# Patient Record
Sex: Female | Born: 1995 | Race: White | Hispanic: No | Marital: Single | Smoking: Current some day smoker
Health system: Southern US, Community
[De-identification: ages and names within clinical notes are randomized; demographics above are authoritative.]

## PROBLEM LIST (undated history)

## (undated) ENCOUNTER — Inpatient Hospital Stay: Payer: Self-pay

## (undated) DIAGNOSIS — D649 Anemia, unspecified: Secondary | ICD-10-CM

## (undated) HISTORY — PX: OTHER SURGICAL HISTORY: SHX169

## (undated) HISTORY — PX: NO PAST SURGERIES: SHX2092

---
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
## 2017-01-16 ENCOUNTER — Observation Stay
Admission: EM | Admit: 2017-01-16 | Discharge: 2017-01-16 | Disposition: A | Discharge status: Home / Self Care | Payer: Medicaid Other | Attending: Obstetrics and Gynecology | Admitting: Obstetrics and Gynecology

## 2017-01-16 DIAGNOSIS — R51 Headache: Secondary | ICD-10-CM | POA: Diagnosis present

## 2017-01-16 DIAGNOSIS — Z3A36 36 weeks gestation of pregnancy: Secondary | ICD-10-CM

## 2017-01-16 DIAGNOSIS — O26893 Other specified pregnancy related conditions, third trimester: Secondary | ICD-10-CM

## 2017-01-16 DIAGNOSIS — M545 Low back pain: Secondary | ICD-10-CM | POA: Diagnosis not present

## 2017-01-16 HISTORY — DX: Anemia, unspecified: D64.9

## 2017-01-16 LAB — URINALYSIS, ROUTINE W REFLEX MICROSCOPIC
Bilirubin Urine: NEGATIVE
GLUCOSE, UA: NEGATIVE mg/dL
HGB URINE DIPSTICK: NEGATIVE
Ketones, ur: NEGATIVE mg/dL
Leukocytes, UA: NEGATIVE
Nitrite: NEGATIVE
PH: 6 (ref 5.0–8.0)
PROTEIN: NEGATIVE mg/dL
Specific Gravity, Urine: 1.024 (ref 1.005–1.030)

## 2017-01-16 MED ORDER — ACETAMINOPHEN 325 MG PO TABS
650.0000 mg | ORAL_TABLET | Freq: Once | ORAL | Status: AC
  Administered 2017-01-16: 650 mg via ORAL

## 2017-01-16 MED ORDER — ACETAMINOPHEN 325 MG PO TABS
ORAL_TABLET | ORAL | Status: AC
  Filled 2017-01-16: qty 2

## 2017-01-16 NOTE — Final Progress Note (Signed)
L&D OB Triage Note  Lacey Riggs is a 21 y.o. G1P0 female at 6648w6d, EDD Estimated Date of Delivery: 02/07/17 who presented to triage for complaints of headache (2/10) for several days, and low back pain x 1 week.  She has not tried any remedies for relief.  She is [facility] (but notes that she is planning to transfer to this area).  She was evaluated by the nurses with no significant findings. Vital signs stable. An NST was performed and has been reviewed by MD. She was treated with Tylenol ES with improvement of symptoms.    NST INTERPRETATION: Indications: rule out uterine contractions  Mode: External Baseline Rate (A): 145 bpm Variability: Moderate Accelerations: 15 x 15 Decelerations: None     Contraction Frequency (min):  (None)  Impression: reactive     Labs:  Results for orders placed or performed during the hospital encounter of 01/16/17  Urinalysis, Routine w reflex microscopic  Result Value Ref Range   Color, Urine AMBER (A) YELLOW   APPearance HAZY (A) CLEAR   Specific Gravity, Urine 1.024 1.005 - 1.030   pH 6.0 5.0 - 8.0   Glucose, UA NEGATIVE NEGATIVE mg/dL   Hgb urine dipstick NEGATIVE NEGATIVE   Bilirubin Urine NEGATIVE NEGATIVE   Ketones, ur NEGATIVE NEGATIVE mg/dL   Protein, ur NEGATIVE NEGATIVE mg/dL   Nitrite NEGATIVE NEGATIVE   Leukocytes, UA NEGATIVE NEGATIVE    Plan: NST performed was reviewed and was found to be reactive. She was discharged home with bleeding/labor precautions. A GBS and GC/Cl was collected.  Continue routine prenatal care. Follow up with OB/GYN as previously scheduled.     Hildred LaserAnika Malita Ignasiak, MD [facility]

## 2017-01-16 NOTE — OB Triage Note (Signed)
Pt states that she has been having some lower back pain for about a week, and she starting having a headache the last couple of days. She also c/o swelling in her feet and ankles.

## 2017-01-18 LAB — CULTURE, BETA STREP (GROUP B ONLY)

## 2017-01-20 DIAGNOSIS — Z23 Encounter for immunization: Secondary | ICD-10-CM | POA: Insufficient documentation

## 2017-02-04 ENCOUNTER — Observation Stay
Admission: EM | Admit: 2017-02-04 | Discharge: 2017-02-04 | Disposition: A | Discharge status: Home / Self Care | Payer: Medicaid Other | Attending: Certified Nurse Midwife | Admitting: Certified Nurse Midwife

## 2017-02-04 DIAGNOSIS — O26893 Other specified pregnancy related conditions, third trimester: Secondary | ICD-10-CM | POA: Diagnosis present

## 2017-02-04 DIAGNOSIS — Z3A39 39 weeks gestation of pregnancy: Secondary | ICD-10-CM | POA: Insufficient documentation

## 2017-02-04 DIAGNOSIS — O471 False labor at or after 37 completed weeks of gestation: Secondary | ICD-10-CM | POA: Diagnosis not present

## 2017-02-04 DIAGNOSIS — O0933 Supervision of pregnancy with insufficient antenatal care, third trimester: Secondary | ICD-10-CM | POA: Insufficient documentation

## 2017-02-04 LAB — URINE DRUG SCREEN, QUALITATIVE (ARMC ONLY)
Amphetamines, Ur Screen: NOT DETECTED
BENZODIAZEPINE, UR SCRN: NOT DETECTED
Barbiturates, Ur Screen: NOT DETECTED
CANNABINOID 50 NG, UR ~~LOC~~: NOT DETECTED
Cocaine Metabolite,Ur ~~LOC~~: NOT DETECTED
MDMA (Ecstasy)Ur Screen: NOT DETECTED
Methadone Scn, Ur: NOT DETECTED
OPIATE, UR SCREEN: NOT DETECTED
PHENCYCLIDINE (PCP) UR S: NOT DETECTED
Tricyclic, Ur Screen: NOT DETECTED

## 2017-02-04 LAB — URINALYSIS, COMPLETE (UACMP) WITH MICROSCOPIC
Bacteria, UA: NONE SEEN
Bilirubin Urine: NEGATIVE
Glucose, UA: NEGATIVE mg/dL
Hgb urine dipstick: NEGATIVE
KETONES UR: NEGATIVE mg/dL
Nitrite: NEGATIVE
PH: 6 (ref 5.0–8.0)
PROTEIN: NEGATIVE mg/dL
Specific Gravity, Urine: 1.021 (ref 1.005–1.030)

## 2017-02-04 LAB — ROM PLUS (ARMC ONLY): Rom Plus: NEGATIVE

## 2017-02-04 MED ORDER — ACETAMINOPHEN 325 MG PO TABS
650.0000 mg | ORAL_TABLET | Freq: Four times a day (QID) | ORAL | Status: AC | PRN
  Administered 2017-02-04: 650 mg via ORAL
  Filled 2017-02-04: qty 2

## 2017-02-04 NOTE — Progress Notes (Signed)
Vaguely answers questions concerning care and her last visit.  Is in the "process of transferring" care to [facility] but doesn't have an appointment with [facility].  Encouraged to make an appt and keep it.  Discussed importance of care.  Pt states "I don't need to go, they want me to go every week but I'm not going.  I'm going to have him in a couple of days anyway."

## 2017-02-04 NOTE — Progress Notes (Signed)
ROM plus results reviewed by CNM. Pt OK to discharge home. Reviewed discharge instructions with patient. She denies questions or concerns. Pt will follow up with Phineas DouglasMichelle Lawhorne, CNM at 1230pm tomorrow at [facility].

## 2017-02-04 NOTE — OB Triage Note (Addendum)
[facility] with left upper abdominal pain and sharp pain in the lower middle abdomen, especially in the morning. Pt first felt pain a couple days ago and says "knew I was getting close so I wanted to see how dilated I was." She states she has not felt  Baby move since yesterday. Denies vaginal bleeding and unusual discharge. Pt "unsure about LOF" She says she saw clear fluid "about 2 weeks ago." VSS. Monitors applied and assessing.

## 2017-02-05 ENCOUNTER — Observation Stay
Admission: EM | Admit: 2017-02-05 | Discharge: 2017-02-05 | Disposition: A | Discharge status: Home / Self Care | Payer: Medicaid Other | Attending: Certified Nurse Midwife | Admitting: Certified Nurse Midwife

## 2017-02-05 ENCOUNTER — Encounter: Payer: Self-pay | Admitting: *Deleted

## 2017-02-05 ENCOUNTER — Encounter: Payer: Self-pay | Admitting: Certified Nurse Midwife

## 2017-02-05 DIAGNOSIS — Z3A39 39 weeks gestation of pregnancy: Secondary | ICD-10-CM | POA: Insufficient documentation

## 2017-02-05 DIAGNOSIS — O471 False labor at or after 37 completed weeks of gestation: Principal | ICD-10-CM | POA: Insufficient documentation

## 2017-02-05 NOTE — ED Notes (Signed)
Faxed over Patient Record Release from to [facility]
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
## 2017-02-06 ENCOUNTER — Other Ambulatory Visit: Payer: Self-pay | Admitting: Certified Nurse Midwife

## 2017-02-06 DIAGNOSIS — O480 Post-term pregnancy: Secondary | ICD-10-CM

## 2017-02-06 DIAGNOSIS — Z3402 Encounter for supervision of normal first pregnancy, second trimester: Secondary | ICD-10-CM | POA: Diagnosis not present

## 2017-02-09 ENCOUNTER — Other Ambulatory Visit: Payer: Self-pay

## 2017-02-09 ENCOUNTER — Other Ambulatory Visit: Payer: Self-pay | Admitting: Certified Nurse Midwife

## 2017-02-10 ENCOUNTER — Encounter: Payer: Self-pay | Admitting: Certified Nurse Midwife

## 2017-02-10 ENCOUNTER — Ambulatory Visit (INDEPENDENT_AMBULATORY_CARE_PROVIDER_SITE_OTHER): Payer: Medicaid Other | Admitting: Certified Nurse Midwife

## 2017-02-10 ENCOUNTER — Ambulatory Visit (INDEPENDENT_AMBULATORY_CARE_PROVIDER_SITE_OTHER): Payer: Medicaid Other

## 2017-02-10 ENCOUNTER — Inpatient Hospital Stay
Admission: EM | Admit: 2017-02-10 | Discharge: 2017-02-15 | DRG: 765 | Disposition: A | Discharge status: Home / Self Care | Payer: Medicaid Other | Attending: Certified Nurse Midwife | Admitting: Certified Nurse Midwife

## 2017-02-10 VITALS — BP 131/81 | HR 92 | Wt 209.5 lb

## 2017-02-10 DIAGNOSIS — D649 Anemia, unspecified: Secondary | ICD-10-CM | POA: Diagnosis present

## 2017-02-10 DIAGNOSIS — O324XX0 Maternal care for high head at term, not applicable or unspecified: Secondary | ICD-10-CM | POA: Diagnosis present

## 2017-02-10 DIAGNOSIS — Z3403 Encounter for supervision of normal first pregnancy, third trimester: Secondary | ICD-10-CM

## 2017-02-10 DIAGNOSIS — O9832 Other infections with a predominantly sexual mode of transmission complicating childbirth: Secondary | ICD-10-CM | POA: Diagnosis present

## 2017-02-10 DIAGNOSIS — O99824 Streptococcus B carrier state complicating childbirth: Secondary | ICD-10-CM | POA: Diagnosis present

## 2017-02-10 DIAGNOSIS — O9902 Anemia complicating childbirth: Secondary | ICD-10-CM | POA: Diagnosis present

## 2017-02-10 DIAGNOSIS — Z3A40 40 weeks gestation of pregnancy: Secondary | ICD-10-CM

## 2017-02-10 DIAGNOSIS — O339 Maternal care for disproportion, unspecified: Secondary | ICD-10-CM | POA: Diagnosis present

## 2017-02-10 DIAGNOSIS — Z3493 Encounter for supervision of normal pregnancy, unspecified, third trimester: Secondary | ICD-10-CM | POA: Diagnosis present

## 2017-02-10 DIAGNOSIS — O4103X0 Oligohydramnios, third trimester, not applicable or unspecified: Principal | ICD-10-CM | POA: Diagnosis present

## 2017-02-10 DIAGNOSIS — O480 Post-term pregnancy: Secondary | ICD-10-CM | POA: Diagnosis not present

## 2017-02-10 DIAGNOSIS — A568 Sexually transmitted chlamydial infection of other sites: Secondary | ICD-10-CM | POA: Diagnosis present

## 2017-02-10 LAB — TYPE AND SCREEN
ABO/RH(D): O POS
ANTIBODY SCREEN: NEGATIVE

## 2017-02-10 LAB — POCT URINALYSIS DIPSTICK
BILIRUBIN UA: NEGATIVE
Blood, UA: NEGATIVE
Glucose, UA: NEGATIVE
KETONES UA: NEGATIVE
LEUKOCYTES UA: NEGATIVE
Nitrite, UA: NEGATIVE
PROTEIN UA: NEGATIVE
Spec Grav, UA: 1.02 (ref 1.010–1.025)
Urobilinogen, UA: 0.2 E.U./dL
pH, UA: 6.5 (ref 5.0–8.0)

## 2017-02-10 LAB — URINE DRUG SCREEN, QUALITATIVE (ARMC ONLY)
AMPHETAMINES, UR SCREEN: NOT DETECTED
BARBITURATES, UR SCREEN: NOT DETECTED
BENZODIAZEPINE, UR SCRN: NOT DETECTED
Cannabinoid 50 Ng, Ur ~~LOC~~: NOT DETECTED
Cocaine Metabolite,Ur ~~LOC~~: NOT DETECTED
MDMA (Ecstasy)Ur Screen: NOT DETECTED
METHADONE SCREEN, URINE: NOT DETECTED
Opiate, Ur Screen: NOT DETECTED
Phencyclidine (PCP) Ur S: NOT DETECTED
TRICYCLIC, UR SCREEN: NOT DETECTED

## 2017-02-10 LAB — CBC
HEMATOCRIT: 32.4 % — AB (ref 35.0–47.0)
HEMOGLOBIN: 11.3 g/dL — AB (ref 12.0–16.0)
MCH: 29.9 pg (ref 26.0–34.0)
MCHC: 34.8 g/dL (ref 32.0–36.0)
MCV: 85.8 fL (ref 80.0–100.0)
Platelets: 222 10*3/uL (ref 150–440)
RBC: 3.78 MIL/uL — ABNORMAL LOW (ref 3.80–5.20)
RDW: 14.5 % (ref 11.5–14.5)
WBC: 9.8 10*3/uL (ref 3.6–11.0)

## 2017-02-10 LAB — CHLAMYDIA/NGC RT PCR (ARMC ONLY)
Chlamydia Tr: DETECTED — AB
N gonorrhoeae: NOT DETECTED

## 2017-02-10 MED ORDER — PENICILLIN G POT IN DEXTROSE 60000 UNIT/ML IV SOLN
3.0000 10*6.[IU] | INTRAVENOUS | Status: DC
  Administered 2017-02-10 – 2017-02-11 (×5): 3 10*6.[IU] via INTRAVENOUS
  Filled 2017-02-10 (×16): qty 50

## 2017-02-10 MED ORDER — OXYTOCIN 40 UNITS IN LACTATED RINGERS INFUSION - SIMPLE MED
2.5000 [IU]/h | INTRAVENOUS | Status: DC
  Administered 2017-02-11: 1000 mL via INTRAVENOUS

## 2017-02-10 MED ORDER — ACETAMINOPHEN 325 MG PO TABS
650.0000 mg | ORAL_TABLET | ORAL | Status: DC | PRN
  Administered 2017-02-11: 650 mg via ORAL
  Filled 2017-02-10: qty 2

## 2017-02-10 MED ORDER — BUTORPHANOL TARTRATE 2 MG/ML IJ SOLN
1.0000 mg | INTRAMUSCULAR | Status: DC | PRN
  Administered 2017-02-11 (×2): 1 mg via INTRAVENOUS
  Filled 2017-02-10 (×2): qty 1

## 2017-02-10 MED ORDER — OXYTOCIN BOLUS FROM INFUSION: 500.0000 mL | Freq: Once | INTRAVENOUS | Status: DC

## 2017-02-10 MED ORDER — TERBUTALINE SULFATE 1 MG/ML IJ SOLN: 0.2500 mg | Freq: Once | INTRAMUSCULAR | Status: DC | PRN

## 2017-02-10 MED ORDER — OXYTOCIN 40 UNITS IN LACTATED RINGERS INFUSION - SIMPLE MED
1.0000 m[IU]/min | INTRAVENOUS | Status: DC
  Filled 2017-02-10 (×3): qty 1000

## 2017-02-10 MED ORDER — MISOPROSTOL 25 MCG QUARTER TABLET
ORAL_TABLET | ORAL | Status: AC
  Filled 2017-02-10: qty 2

## 2017-02-10 MED ORDER — SOD CITRATE-CITRIC ACID 500-334 MG/5ML PO SOLN
30.0000 mL | ORAL | Status: DC | PRN
  Administered 2017-02-11: 30 mL via ORAL
  Filled 2017-02-10: qty 15

## 2017-02-10 MED ORDER — MISOPROSTOL 25 MCG QUARTER TABLET
50.0000 ug | ORAL_TABLET | Freq: Once | ORAL | Status: AC
  Administered 2017-02-10: 50 ug via ORAL

## 2017-02-10 MED ORDER — ONDANSETRON HCL 4 MG/2ML IJ SOLN: 4.0000 mg | Freq: Four times a day (QID) | INTRAMUSCULAR | Status: DC | PRN

## 2017-02-10 MED ORDER — LIDOCAINE HCL (PF) 1 % IJ SOLN
30.0000 mL | INTRAMUSCULAR | Status: DC | PRN
  Filled 2017-02-10: qty 30

## 2017-02-10 MED ORDER — LACTATED RINGERS IV SOLN
INTRAVENOUS | Status: DC
  Administered 2017-02-10 – 2017-02-11 (×3): 1000 mL via INTRAVENOUS

## 2017-02-10 MED ORDER — LACTATED RINGERS IV SOLN
500.0000 mL | INTRAVENOUS | Status: DC | PRN
  Administered 2017-02-11: 500 mL via INTRAVENOUS

## 2017-02-10 MED ORDER — MISOPROSTOL 200 MCG PO TABS
ORAL_TABLET | ORAL | Status: AC
  Administered 2017-02-10: 25 ug via ORAL
  Filled 2017-02-10: qty 4

## 2017-02-10 MED ORDER — AMMONIA AROMATIC IN INHA
RESPIRATORY_TRACT | Status: AC
  Filled 2017-02-10: qty 10

## 2017-02-10 MED ORDER — MISOPROSTOL 25 MCG QUARTER TABLET
25.0000 ug | ORAL_TABLET | ORAL | Status: DC
  Administered 2017-02-10: 25 ug via ORAL
  Filled 2017-02-10: qty 1

## 2017-02-10 MED ORDER — OXYTOCIN 10 UNIT/ML IJ SOLN: 10.0000 [IU] | Freq: Once | INTRAMUSCULAR | Status: DC

## 2017-02-10 MED ORDER — PENICILLIN G POTASSIUM 5000000 UNITS IJ SOLR
5.0000 10*6.[IU] | Freq: Once | INTRAVENOUS | Status: AC
  Administered 2017-02-10: 5 10*6.[IU] via INTRAVENOUS
  Filled 2017-02-10 (×2): qty 5

## 2017-02-10 NOTE — H&P (Signed)
History and Physical   HPI  Lacey BerkshireCasey R Riggs is a 21 y.o. G1P0 at 4558w3d Estimated Date of Delivery: 02/07/17 who is being admitted for induction of labor. PT presented to office today for BPP @ 40 wk 3 days. BPP 6/8 for   AFI -0, EFW: 3462 g 41st percentile. She had been receiving [facility] Valentino Sax[facility]herry [facility].    OB History  Obstetric History   G1   P0   T0   P0   A0   L0    SAB0   TAB0   Ectopic0   Multiple0   Live Births0     # Outcome Date GA Lbr Len/2nd Weight Sex Delivery Anes PTL Lv  1 Current               PROBLEM LIST  Pregnancy complications or risks: Patient Active Problem List   Diagnosis Date Noted  . Labor and delivery, indication for care 02/10/2017  . Indication for care in labor and delivery, antepartum 02/04/2017  . Need for chickenpox vaccination 01/20/2017  . Indication for care in labor or delivery 01/16/2017    Prenatal labs and studies: ABO, Rh: O/Positive/-- (04/27 0000) Antibody: Negative (04/27 0000) Rubella: Immune (04/27 0000) RPR: Nonreactive (05/18 0000)  HBsAg: Negative (04/27 0000)  HIV: Non-reactive (05/18 0000)  GBS: Positive  1 hr Glucola : WNL Genetic screening Not completed Anatomy US normal  Prenatal Transfer Tool  Maternal Diabetes: No Genetic Screening: Declined Maternal Ultrasounds/Referrals: Declined Fetal Ultrasounds or other Referrals:  None Maternal Substance Abuse:  No Significant Maternal Medications:  None Significant Maternal Lab Results: None   Past Medical History:  Diagnosis Date  . Anemia      Past Surgical History:  Procedure Laterality Date  . NO PAST SURGERIES    . none       Medications    Current Discharge Medication List    CONTINUE these medications which have NOT CHANGED   Details  Prenatal Vit-Fe Fumarate-FA (MULTIVITAMIN-PRENATAL) 27-0.8 MG TABS tablet Take 1 tablet by mouth daily at 12 noon.          Allergies  Patient has no known allergies.  Review of Systems  Constitutional: negative Eyes: negative Ears, nose, mouth, throat, and face: negative Respiratory: negative Cardiovascular: negative Gastrointestinal: negative Genitourinary:negative Integument/breast: negative Hematologic/lymphatic: negative Musculoskeletal:negative Neurological: negative Behavioral/Psych: negative Endocrine: negative Allergic/Immunologic: negative  Physical Exam  BP 128/65 (BP Location: Right Arm)   Pulse 92   Temp 98.3 F (36.8 C) (Oral)   Resp 20   Ht 5\' 3"  (1.6 m)   Wt 209 lb (94.8 kg)   LMP 05/03/2016 (Approximate)   SpO2 100%   BMI 37.02 kg/m   Lungs:  CTA B Cardio: RRR  Abd: Soft, gravid, NT Presentation: cephalic EXT: No C/C/ 2+ Edema DTRs: 2+ B CERVIX: 0.5 cm:50%: -3: posterior: firm  See Prenatal records for more detailed PE.     FHR:  Baseline: 150 bpm, Variability: Good {> 6 bpm), Accelerations: Reactive and Decelerations: variable   Toco: Uterine Contractions: None   Test Results  Results for orders placed or performed in visit on 02/10/17 (from the past 24 hour(s))  POCT urinalysis dipstick     Status: None   Collection Time: 02/10/17  2:26 PM  Result Value Ref Range   Color, UA yellow    Clarity, UA cloudy    Glucose, UA negative  Bilirubin, UA negative    Ketones, UA negative    Spec Grav, UA 1.020 1.010 - 1.025   Blood, UA negative    pH, UA 6.5 5.0 - 8.0   Protein, UA negative    Urobilinogen, UA 0.2 0.2 or 1.0 E.U./dL   Nitrite, UA negative    Leukocytes, UA Negative Negative   Group B Strep positive  Assessment   G1P0 at [redacted]w[redacted]d Estimated Date of Delivery: 02/07/17  The fetus is reassuring.  Patient Active Problem List   Diagnosis Date Noted  . Labor and delivery, indication for care 02/10/2017  . Indication for care in labor and delivery, antepartum 02/04/2017  . Need for chickenpox vaccination 01/20/2017  . Indication for  care in labor or delivery 01/16/2017    Plan  1. Admit to L&D for induction 2. EFM:-- Category 2 3. Epidural if desired. Stadol for IV pain until epidural requested. 4. Admission labs  5. Cytotec for cervical ripening.   Doreene Burke, CNM 02/10/2017 6:08 PM

## 2017-02-10 NOTE — Progress Notes (Signed)
ROB-Patient her for BPP, previously seen in OB Triage. BPP: 6/8, AFI 0. Findings reviewed with patient, verbalized understanding. Reviewed risk and benefits of induction of labor verses continued pregnancy. Discussed induction of labor options. Pt agrees to induction of labor. Meeting with Jola BabinskiMarilyn, [facility] P[facility] Care Manager. Instructed patient to eat meal and then [facility]. [facility], Estate manager/land agentLabor and Delivery RN. Report called to [facility], on call CNM.   ULTRASOUND REPORT  Location: [facility] Date of Service: 02/10/17  Indications:Fetal BPP and Growth Findings:  Singleton intrauterine pregnancy is visualized with FHR at 163 BPM. Biometrics give an (U/S) Gestational age of 19 4/7 weeks and an (U/S) EDD of 02/20/17; this correlates with the clinically established EDD of 02/04/17.  Fetal presentation is Vertex.  EFW: 3462g (7lb 10oz) Williams 41 percentile. Placenta: .Posterior AFI: There is no evidence of amniotic fluid.  .  Fetal stomach and bladder are visualized.  BPP Score 6 of 8. Fetal movement, fetal tone and fetal breathing are demonstrated with the 30 minute window.  Oligohyramnios demonstrated with AFI. measurement of 0.  Impression: 1. 38 4/7 week Viable Singleton Intrauterine pregnancy by U/S. 2. (U/S) EDD is consistent with Clinically established (LMP) EDD of 02/04/17. 3. BPP score 6 of 8 4. Oligohydramnios  Recommendations: 1.Clinical correlation with the patient's History and Physical Exam.

## 2017-02-11 ENCOUNTER — Encounter
Admission: EM | Disposition: A | Discharge status: Home / Self Care | Payer: Self-pay | Source: Home / Self Care | Attending: Certified Nurse Midwife

## 2017-02-11 ENCOUNTER — Inpatient Hospital Stay: Payer: Medicaid Other | Admitting: Anesthesiology

## 2017-02-11 LAB — RPR: RPR: NONREACTIVE

## 2017-02-11 SURGERY — Surgical Case
Anesthesia: Epidural | Site: Abdomen | Wound class: Clean Contaminated

## 2017-02-11 MED ORDER — LIDOCAINE 5 % EX PTCH
MEDICATED_PATCH | CUTANEOUS | Status: DC | PRN
  Administered 2017-02-11: 1 via TRANSDERMAL

## 2017-02-11 MED ORDER — PHENYLEPHRINE 40 MCG/ML (10ML) SYRINGE FOR IV PUSH (FOR BLOOD PRESSURE SUPPORT): 80.0000 ug | PREFILLED_SYRINGE | INTRAVENOUS | Status: DC | PRN

## 2017-02-11 MED ORDER — MISOPROSTOL 25 MCG QUARTER TABLET
ORAL_TABLET | ORAL | Status: AC
  Filled 2017-02-11: qty 2

## 2017-02-11 MED ORDER — LIDOCAINE HCL (PF) 1 % IJ SOLN
INTRAMUSCULAR | Status: DC | PRN
  Administered 2017-02-11: 3 mL

## 2017-02-11 MED ORDER — ONDANSETRON HCL 4 MG/2ML IJ SOLN
INTRAMUSCULAR | Status: DC | PRN
  Administered 2017-02-11: 4 mg via INTRAVENOUS

## 2017-02-11 MED ORDER — FENTANYL 2.5 MCG/ML W/ROPIVACAINE 0.15% IN NS 100 ML EPIDURAL (ARMC)
EPIDURAL | Status: AC
  Filled 2017-02-11: qty 100

## 2017-02-11 MED ORDER — BUPIVACAINE HCL (PF) 0.5 % IJ SOLN
INTRAMUSCULAR | Status: DC | PRN
  Administered 2017-02-11 (×2): 2 mL via EPIDURAL
  Administered 2017-02-11: 4 mL via EPIDURAL

## 2017-02-11 MED ORDER — MISOPROSTOL 25 MCG QUARTER TABLET
50.0000 ug | ORAL_TABLET | Freq: Once | ORAL | Status: AC
  Administered 2017-02-11: 50 ug via ORAL

## 2017-02-11 MED ORDER — DIPHENHYDRAMINE HCL 50 MG/ML IJ SOLN: 12.5000 mg | INTRAMUSCULAR | Status: DC | PRN

## 2017-02-11 MED ORDER — ONDANSETRON HCL 4 MG/2ML IJ SOLN
INTRAMUSCULAR | Status: AC
  Filled 2017-02-11: qty 2

## 2017-02-11 MED ORDER — FENTANYL 2.5 MCG/ML W/ROPIVACAINE 0.15% IN NS 100 ML EPIDURAL (ARMC)
12.0000 mL/h | EPIDURAL | Status: DC
  Administered 2017-02-11 (×2): 12 mL/h via EPIDURAL

## 2017-02-11 MED ORDER — BUPIVACAINE HCL (PF) 0.25 % IJ SOLN
INTRAMUSCULAR | Status: DC | PRN
  Administered 2017-02-11 (×2): 4 mL via EPIDURAL

## 2017-02-11 MED ORDER — LIDOCAINE 2% (20 MG/ML) 5 ML SYRINGE
INTRAMUSCULAR | Status: DC | PRN
  Administered 2017-02-11 (×2): 200 mg via INTRAVENOUS

## 2017-02-11 MED ORDER — LIDOCAINE-EPINEPHRINE (PF) 1.5 %-1:200000 IJ SOLN
INTRAMUSCULAR | Status: DC | PRN
  Administered 2017-02-11: 3 mL via EPIDURAL

## 2017-02-11 MED ORDER — LIDOCAINE 5 % EX PTCH
MEDICATED_PATCH | CUTANEOUS | Status: AC
  Filled 2017-02-11: qty 1

## 2017-02-11 MED ORDER — LACTATED RINGERS IV SOLN: INTRAVENOUS | Status: DC

## 2017-02-11 MED ORDER — METHYLERGONOVINE MALEATE 0.2 MG/ML IJ SOLN
INTRAMUSCULAR | Status: AC
Start: 2017-02-11 — End: 2017-02-12
  Filled 2017-02-11: qty 1

## 2017-02-11 MED ORDER — EPHEDRINE 5 MG/ML INJ: 10.0000 mg | INTRAVENOUS | Status: DC | PRN

## 2017-02-11 MED ORDER — LACTATED RINGERS IV SOLN: 500.0000 mL | Freq: Once | INTRAVENOUS | Status: DC

## 2017-02-11 MED ORDER — PHENYLEPHRINE HCL 10 MG/ML IJ SOLN
INTRAMUSCULAR | Status: DC | PRN
  Administered 2017-02-11: 100 ug via INTRAVENOUS

## 2017-02-11 MED ORDER — CEFAZOLIN SODIUM-DEXTROSE 2-4 GM/100ML-% IV SOLN
2.0000 g | INTRAVENOUS | Status: AC
  Administered 2017-02-11: 2 g via INTRAVENOUS
  Filled 2017-02-11: qty 100

## 2017-02-11 MED ORDER — MISOPROSTOL 25 MCG QUARTER TABLET
50.0000 ug | ORAL_TABLET | ORAL | Status: DC
  Administered 2017-02-11: 50 ug via ORAL

## 2017-02-11 SURGICAL SUPPLY — 24 items
ADHESIVE MASTISOL STRL (MISCELLANEOUS) ×3 IMPLANT
BAG COUNTER SPONGE EZ (MISCELLANEOUS) ×4 IMPLANT
CANISTER SUCT 3000ML PPV (MISCELLANEOUS) ×3 IMPLANT
CELL SAVER LIPIGURD (MISCELLANEOUS) ×1 IMPLANT
CHLORAPREP W/TINT 26ML (MISCELLANEOUS) ×6 IMPLANT
COUNTER SPONGE BAG EZ (MISCELLANEOUS) ×2
DRSG TELFA 3X8 NADH (GAUZE/BANDAGES/DRESSINGS) ×3 IMPLANT
EXTRT SYSTEM ALEXIS 14CM (MISCELLANEOUS) ×3
GAUZE SPONGE 4X4 12PLY STRL (GAUZE/BANDAGES/DRESSINGS) ×3 IMPLANT
GLOVE INDICATOR 7.0 STRL GRN (GLOVE) ×6 IMPLANT
GLOVE ORTHO TXT STRL SZ7.5 (GLOVE) ×6 IMPLANT
GLOVE PROTEXIS LATEX SZ 7.5 (GLOVE) ×3 IMPLANT
GOWN STRL REUS W/ TWL LRG LVL3 (GOWN DISPOSABLE) ×3 IMPLANT
GOWN STRL REUS W/TWL LRG LVL3 (GOWN DISPOSABLE) ×6
KIT RM TURNOVER STRD PROC AR (KITS) ×3 IMPLANT
NS IRRIG 1000ML POUR BTL (IV SOLUTION) ×3 IMPLANT
PACK C SECTION AR (MISCELLANEOUS) ×3 IMPLANT
PAD OB MATERNITY 4.3X12.25 (PERSONAL CARE ITEMS) ×6 IMPLANT
PAD PREP 24X41 OB/GYN DISP (PERSONAL CARE ITEMS) ×3 IMPLANT
RETRACTOR WND ALEXIS-O 25 LRG (MISCELLANEOUS) ×1 IMPLANT
RTRCTR WOUND ALEXIS O 25CM LRG (MISCELLANEOUS) ×3
SPONGE LAP 18X18 5 PK (GAUZE/BANDAGES/DRESSINGS) ×3 IMPLANT
SUT VIC AB 1 CT1 36 (SUTURE) ×6 IMPLANT
SUT VICRYL+ 3-0 36IN CT-1 (SUTURE) ×6 IMPLANT

## 2017-02-11 NOTE — Anesthesia Procedure Notes (Signed)
Epidural Patient location during procedure: OB Start time: 02/11/2017 1:04 PM End time: 02/11/2017 1:09 PM  Staffing Anesthesiologist: Rosaria FerriesPISCITELLO, JOSEPH K Resident/CRNA: Ginger CarneMICHELET, Kalya Troeger Performed: resident/CRNA   Preanesthetic Checklist Completed: patient identified, site marked, surgical consent, pre-op evaluation, timeout performed, IV checked, risks and benefits discussed and monitors and equipment checked  Epidural Patient position: sitting Prep: Betadine Patient monitoring: heart rate, continuous pulse ox and blood pressure Approach: midline Location: L4-L5 Injection technique: LOR saline  Needle:  Needle type: Tuohy  Needle gauge: 17 G Needle length: 9 cm and 9 Needle insertion depth: 6 cm Catheter type: closed end flexible Catheter size: 19 Gauge Catheter at skin depth: 12 cm Test dose: negative and 1.5% lidocaine with Epi 1:200 K  Assessment Sensory level: T10 Events: blood not aspirated, injection not painful, no injection resistance, negative IV test and no paresthesia  Additional Notes Pt. Evaluated and documentation done after procedure finished. Patient identified. Risks/Benefits/Options discussed with patient including but not limited to bleeding, infection, nerve damage, paralysis, failed block, incomplete pain control, headache, blood pressure changes, nausea, vomiting, reactions to medication both or allergic, itching and postpartum back pain. Confirmed with bedside nurse the patient's most recent platelet count. Confirmed with patient that they are not currently taking any anticoagulation, have any bleeding history or any family history of bleeding disorders. Patient expressed understanding and wished to proceed. All questions were answered. Sterile technique was used throughout the entire procedure. Please see nursing notes for vital signs. Test dose was given through epidural catheter and negative prior to continuing to dose epidural or start infusion.  Warning signs of high block given to the patient including shortness of breath, tingling/numbness in hands, complete motor block, or any concerning symptoms with instructions to call for help. Patient was given instructions on fall risk and not to get out of bed. All questions and concerns addressed with instructions to call with any issues or inadequate analgesia.   Patient tolerated the insertion well without immediate complications.Reason for block:procedure for pain

## 2017-02-11 NOTE — Progress Notes (Signed)
SUBJECTIVE:  Patient complaining of back pain and intermittent pressure OBJECTIVE:  BP 120/86   Pulse 87   Temp 98.3 F (36.8 C) (Oral)   Resp 18   Ht 5\' 3"  (1.6 m)   Wt 209 lb (94.8 kg)   LMP 05/03/2016 (Approximate)   SpO2 99%   BMI 37.02 kg/m  No intake/output data recorded.  She has shown cervical change. CERVIX: 8:100%: 0: mid position: soft SVE:   Dilation: 8 Effacement (%): 100 Station: 0 Exam by:: Doreene Burkehompson, Jaiden Wahab CONTRACTIONS: regular, every 1-2 minutes FHR: Fetal heart tracing reviewed. Baseline: 150 bpm, Variability: Good {> 6 bpm), Accelerations: non reactive and Decelerations: late, variable Category II              Analgesia: Epidural  Labs: Recent Labs       Lab Results  Component Value Date   WBC 9.8 02/10/2017   HGB 11.3 (L) 02/10/2017   HCT 32.4 (L) 02/10/2017   MCV 85.8 02/10/2017   PLT 222 02/10/2017      ASSESSMENT: 1) Labor curve reviewed.       Progress: Prolonged active phase labor., Dysfunctional uterine contractions. and Fetal intolerance of labor.     Membranes: ruptured      Active Problems:   Labor and delivery, indication for care Oligohydramnios  PLAN: maternal oxygen administration, IV fluid bolus, change maternal position and amnioinfusion. Consulted Dr.Evan regarding arrest of labor and fetal intolerance of labor. Plan for primary cesarean section.

## 2017-02-11 NOTE — Progress Notes (Signed)
LABOR NOTE   Lacey BerkshireCasey R Riggs 21 y.o.GP@ at 4461w4d Not in labor.  SUBJECTIVE:  She presented yesterday with AFI 0 from the office for induction. Cytotec given throughout the night. She is having cramping with pain 8/10. Wincing with contractions. She is requesting IV pain medication.  OBJECTIVE:  BP 130/73   Pulse 92   Temp 98.4 F (36.9 C)   Resp 20   Ht 5\' 3"  (1.6 m)   Wt 209 lb (94.8 kg)   LMP 05/03/2016 (Approximate)   SpO2 100%   BMI 37.02 kg/m  No intake/output data recorded.  She has not shown cervical change. CERVIX: 0.5 cm per RN exam @ 0530 SVE:   Dilation: Fingertip Effacement (%): 50 Station: -3 Exam by:: Lacey Riggs. Evans, RN CONTRACTIONS: irritability  FHR: Fetal heart tracing reviewed. Baseline: 130 bpm, Variability: Good {> 6 bpm), Accelerations: Reactive and Decelerations: Absent Category I   Analgesia: IV pain meds  Labs: Lab Results  Component Value Date   WBC 9.8 02/10/2017   HGB 11.3 (L) 02/10/2017   HCT 32.4 (L) 02/10/2017   MCV 85.8 02/10/2017   PLT 222 02/10/2017    ASSESSMENT: 1) Labor curve reviewed.       Progress: Not in labor.     Membranes: intact   Active Problems:   Labor and delivery, indication for care   PLAN: continue present management and Discussed placement of foley bulb if no change at next exam. Reviewed pain medication options. Place next dose of Cytotec re evaluate in 2-4 hrs.   Doreene Burkennie Minsa Weddington, CNM 02/11/2017 7:17 AM

## 2017-02-11 NOTE — Progress Notes (Addendum)
LABOR NOTE   Lacey BerkshireCasey R Riggs 21 y.o.GP@ at 2465w4d Prolonged active phase labor.  SUBJECTIVE:  Patient complaining of back pain and intermittent pressure OBJECTIVE:  BP 120/86   Pulse 87   Temp 98.3 F (36.8 C) (Oral)   Resp 18   Ht 5\' 3"  (1.6 m)   Wt 209 lb (94.8 kg)   LMP 05/03/2016 (Approximate)   SpO2 99%   BMI 37.02 kg/m  No intake/output data recorded.  She has shown cervical change. CERVIX: 8:100%: 0: mid position: soft SVE:   Dilation: 8 Effacement (%): 100 Station: 0 Exam by:: Doreene Burkehompson, Cyndee Giammarco CONTRACTIONS: regular, every 1-2 minutes FHR: Fetal heart tracing reviewed. Baseline: 160 bpm, Variability: Good {> 6 bpm), Accelerations: non reactive and Decelerations: late, variable Category II   Analgesia: Epidural  Labs: Lab Results  Component Value Date   WBC 9.8 02/10/2017   HGB 11.3 (L) 02/10/2017   HCT 32.4 (L) 02/10/2017   MCV 85.8 02/10/2017   PLT 222 02/10/2017    ASSESSMENT: 1) Labor curve reviewed.       Progress: Prolonged active phase labor., Dysfunctional uterine contractions. and Fetal intolerance of labor.     Membranes: ruptured      Active Problems:   Labor and delivery, indication for care Oligohydramnios  PLAN: maternal oxygen administration, IV fluid bolus, change maternal position and amnioinfusion.  Doreene Burkennie Espn Zeman, CNM 02/11/2017 10:29 PM

## 2017-02-11 NOTE — Progress Notes (Signed)
LABOR NOTE   Lacey BerkshireCasey R Riggs 21 y.o.GP@ at 4432w4d Active phase labor.  SUBJECTIVE:  Comfortable with Epidural.  OBJECTIVE:  BP 129/80   Pulse 76   Temp 98.3 F (36.8 C) (Oral)   Resp 18   Ht 5\' 3"  (1.6 m)   Wt 209 lb (94.8 kg)   LMP 05/03/2016 (Approximate)   SpO2 99%   BMI 37.02 kg/m  No intake/output data recorded.  She has shown cervical change. CERVIX: 5cm:100%: -2: mid position: soft SVE:   Dilation: 5 Effacement (%): 100 Station: -2 Exam by:: Lacey Riggs cnm CONTRACTIONS: regular, every 2 minutes FHR: Fetal heart tracing reviewed. Baseline: 145 bpm, Variability: Good {> 6 bpm), Accelerations: Reactive and Decelerations: variable, early , and late  Category II  Analgesia: Epidural  Labs: Lab Results  Component Value Date   WBC 9.8 02/10/2017   HGB 11.3 (L) 02/10/2017   HCT 32.4 (L) 02/10/2017   MCV 85.8 02/10/2017   PLT 222 02/10/2017    ASSESSMENT: 1) Labor curve reviewed.       Progress: Active phase labor.     Membranes: ruptured       Active Problems:   Labor and delivery, indication for care   PLAN: expectant management, pt turned to her side with peanut ball. Encouraged to rest and notify RN with constant rectal pressure.   Doreene Burkennie Mehdi Gironda, CNM 02/11/2017 1:51 PM

## 2017-02-11 NOTE — Progress Notes (Addendum)
LABOR NOTE   Lacey BerkshireCasey R Riggs 21 y.o.GP@ at 345w4d Prolonged active phase labor.  SUBJECTIVE:  Remains comfortable with epidural. No complaints . Denies pressure or urge to push.  OBJECTIVE:  BP 120/86   Pulse 87   Temp 98.3 F (36.8 C) (Oral)   Resp 18   Ht 5\' 3"  (1.6 m)   Wt 209 lb (94.8 kg)   LMP 05/03/2016 (Approximate)   SpO2 99%   BMI 37.02 kg/m  No intake/output data recorded.  She has shown cervical change. CERVIX: 7-8 cm:100%: 0: mid position: soft SVE:   Dilation: 7.5 Effacement (%): 100 Station: 0 Exam by:: Doreene Burkehompson, Kaitelyn Jamison CONTRACTIONS: regular, every 2 minutes FHR: Fetal heart tracing reviewed. Baseline: 150 bpm, Variability: Good {> 6 bpm), Accelerations: no accelerations and Decelerations: variable  Category II   Analgesia: Epidural  Labs: Lab Results  Component Value Date   WBC 9.8 02/10/2017   HGB 11.3 (L) 02/10/2017   HCT 32.4 (L) 02/10/2017   MCV 85.8 02/10/2017   PLT 222 02/10/2017    ASSESSMENT: 1) Labor curve reviewed.       Progress: Dysfunctional uterine contractions.     Membranes: ruptured     IUPC-MVU's 225      Active Problems:   Labor and delivery, indication for care Oligohydramnios   PLAN: continue present management, IV Pitocin augmentation, IV fluid bolus and change maternal position. Amnioinfusion: bolus 400 ml, then 200 ml hr.   Doreene Burkennie Dock Baccam, CNM 02/11/2017 8:32 PM

## 2017-02-11 NOTE — Progress Notes (Signed)
LABOR NOTE   Lacey BerkshireCasey R Riggs 21 y.o.GP@ at 9823w4d Early latent labor.  SUBJECTIVE:  Lacey LyonsCasey is very uncomfortable with contractions rating pain 07-14-08/10. She has used a few doses of IV pain medication is now trying nitrous.   OBJECTIVE:  BP 129/80   Pulse 76   Temp 98.3 F (36.8 C) (Oral)   Resp 16   Ht 5\' 3"  (1.6 m)   Wt 209 lb (94.8 kg)   LMP 05/03/2016 (Approximate)   SpO2 100%   BMI 37.02 kg/m  No intake/output data recorded.  She has shown cervical change. CERVIX: 3-4cm:80%: -2: mid position: firm SVE:   Dilation: 3.5 Effacement (%): 80 Station: -2 Exam by:: a Marlyn Tondreau cnm CONTRACTIONS: regular, every 2-4 minutes FHR: Fetal heart tracing reviewed. Baseline: 130 bpm, Variability: Good {> 6 bpm), Accelerations: Reactive and Decelerations: Absent Category I   Analgesia: Nitrous Oxide  Labs: Lab Results  Component Value Date   WBC 9.8 02/10/2017   HGB 11.3 (L) 02/10/2017   HCT 32.4 (L) 02/10/2017   MCV 85.8 02/10/2017   PLT 222 02/10/2017    ASSESSMENT: 1) Labor curve reviewed.       Progress: Early latent labor.     Membranes: intact      Active Problems:   Labor and delivery, indication for care   PLAN: Foley bulb placement if cervix unchanged. On exam cervix 3-4 cm discussed placement of epidural and then possible ROM after placement. Pt agrees to plan of care.  Lacey Riggs, CNM 02/11/2017 1:27 PM

## 2017-02-11 NOTE — Anesthesia Preprocedure Evaluation (Signed)
Anesthesia Evaluation  Patient identified by MRN, date of birth, ID band Patient awake    Reviewed: Allergy & Precautions, H&P , NPO status   History of Anesthesia Complications Negative for: history of anesthetic complications  Airway Mallampati: II  TM Distance: <3 FB Neck ROM: full  Mouth opening: Limited Mouth Opening  Dental no notable dental hx.    Pulmonary neg pulmonary ROS,    Pulmonary exam normal        Cardiovascular Exercise Tolerance: Good negative cardio ROS Normal cardiovascular exam     Neuro/Psych negative neurological ROS  negative psych ROS   GI/Hepatic negative GI ROS, Neg liver ROS,   Endo/Other  negative endocrine ROS  Renal/GU negative Renal ROS  negative genitourinary   Musculoskeletal   Abdominal   Peds  Hematology  (+) anemia ,   Anesthesia Other Findings   Reproductive/Obstetrics (+) Pregnancy                             Anesthesia Physical Anesthesia Plan  ASA: II  Anesthesia Plan: Epidural   Post-op Pain Management:    Induction:   PONV Risk Score and Plan: 0 and Ondansetron, Dexamethasone and Treatment may vary due to age  Airway Management Planned:   Additional Equipment:   Intra-op Plan:   Post-operative Plan:   Informed Consent: I have reviewed the patients History and Physical, chart, labs and discussed the procedure including the risks, benefits and alternatives for the proposed anesthesia with the patient or authorized representative who has indicated his/her understanding and acceptance.   Dental Advisory Given  Plan Discussed with: Anesthesiologist  Anesthesia Plan Comments:         Anesthesia Quick Evaluation

## 2017-02-11 NOTE — Progress Notes (Signed)
LABOR NOTE   Lacey Riggs 21 y.o.GP@ at 7429w4d Active phase labor.  SUBJECTIVE:  She is comfortable with epidural and denies pressure or urge to push. She has had an opportunity to nap this afternoon.  OBJECTIVE:  BP 120/86   Pulse 87   Temp 98.3 F (36.8 C) (Oral)   Resp 18   Ht 5\' 3"  (1.6 m)   Wt 209 lb (94.8 kg)   LMP 05/03/2016 (Approximate)   SpO2 99%   BMI 37.02 kg/m  Total I/O In: -  Out: 1050 [Urine:1050]  She has shown cervical change. CERVIX: 6cm:100%: -2: mid position: soft SVE:   Dilation: 6 Effacement (%): 100 Station: -2 Exam by:: Lacey Riggs, Lacey Riggs CONTRACTIONS: irregular, every 2-5 minutes FHR: Fetal heart tracing reviewed. Baseline: 145 bpm, Variability: Good {> 6 bpm), Accelerations: Reactive and Decelerations: variable Category II   Analgesia: Epidural  Labs: Lab Results  Component Value Date   WBC 9.8 02/10/2017   HGB 11.3 (L) 02/10/2017   HCT 32.4 (L) 02/10/2017   MCV 85.8 02/10/2017   PLT 222 02/10/2017    ASSESSMENT: 1) Labor curve reviewed.       Progress: Active phase labor.     Membranes: ruptured      Active Problems:   Labor and delivery, indication for care Oligohydramnios   PLAN: IV Pitocin augmentation if need. IUPC placed.   Lacey Burkennie Lacey Riggs, CNM 02/11/2017 6:20 PM

## 2017-02-12 ENCOUNTER — Encounter: Payer: Self-pay | Admitting: Obstetrics and Gynecology

## 2017-02-12 DIAGNOSIS — Z3A40 40 weeks gestation of pregnancy: Secondary | ICD-10-CM

## 2017-02-12 DIAGNOSIS — O4103X0 Oligohydramnios, third trimester, not applicable or unspecified: Secondary | ICD-10-CM

## 2017-02-12 LAB — CBC
HEMATOCRIT: 28.8 % — AB (ref 35.0–47.0)
Hemoglobin: 9.7 g/dL — ABNORMAL LOW (ref 12.0–16.0)
MCH: 28.7 pg (ref 26.0–34.0)
MCHC: 33.8 g/dL (ref 32.0–36.0)
MCV: 85.1 fL (ref 80.0–100.0)
PLATELETS: 214 10*3/uL (ref 150–440)
RBC: 3.38 MIL/uL — ABNORMAL LOW (ref 3.80–5.20)
RDW: 14.4 % (ref 11.5–14.5)
WBC: 15.7 10*3/uL — AB (ref 3.6–11.0)

## 2017-02-12 MED ORDER — ZOLPIDEM TARTRATE 5 MG PO TABS: 5.0000 mg | ORAL_TABLET | Freq: Every evening | ORAL | Status: DC | PRN

## 2017-02-12 MED ORDER — WITCH HAZEL-GLYCERIN EX PADS: 1.0000 "application " | MEDICATED_PAD | CUTANEOUS | Status: DC | PRN

## 2017-02-12 MED ORDER — MENTHOL 3 MG MT LOZG
1.0000 | LOZENGE | OROMUCOSAL | Status: DC | PRN
  Filled 2017-02-12: qty 9

## 2017-02-12 MED ORDER — OXYTOCIN 40 UNITS IN LACTATED RINGERS INFUSION - SIMPLE MED: 2.5000 [IU]/h | INTRAVENOUS | Status: DC

## 2017-02-12 MED ORDER — LACTATED RINGERS IV SOLN: INTRAVENOUS | Status: DC

## 2017-02-12 MED ORDER — ONDANSETRON HCL 4 MG/2ML IJ SOLN: 4.0000 mg | Freq: Once | INTRAMUSCULAR | Status: DC | PRN

## 2017-02-12 MED ORDER — FENTANYL CITRATE (PF) 100 MCG/2ML IJ SOLN
25.0000 ug | INTRAMUSCULAR | Status: DC | PRN
  Administered 2017-02-12 (×2): 25 ug via INTRAVENOUS

## 2017-02-12 MED ORDER — SIMETHICONE 80 MG PO CHEW
80.0000 mg | CHEWABLE_TABLET | Freq: Four times a day (QID) | ORAL | Status: DC
  Administered 2017-02-12 – 2017-02-15 (×10): 80 mg via ORAL
  Filled 2017-02-12 (×11): qty 1

## 2017-02-12 MED ORDER — COCONUT OIL OIL: 1.0000 "application " | TOPICAL_OIL | Status: DC | PRN

## 2017-02-12 MED ORDER — IBUPROFEN 600 MG PO TABS
600.0000 mg | ORAL_TABLET | Freq: Four times a day (QID) | ORAL | Status: DC
  Administered 2017-02-12 – 2017-02-15 (×12): 600 mg via ORAL
  Filled 2017-02-12 (×13): qty 1

## 2017-02-12 MED ORDER — SENNOSIDES-DOCUSATE SODIUM 8.6-50 MG PO TABS
2.0000 | ORAL_TABLET | ORAL | Status: DC
  Administered 2017-02-13 – 2017-02-14 (×2): 2 via ORAL
  Filled 2017-02-12 (×2): qty 2

## 2017-02-12 MED ORDER — FENTANYL CITRATE (PF) 100 MCG/2ML IJ SOLN
INTRAMUSCULAR | Status: AC
  Filled 2017-02-12: qty 2

## 2017-02-12 MED ORDER — OXYCODONE-ACETAMINOPHEN 5-325 MG PO TABS
1.0000 | ORAL_TABLET | ORAL | Status: DC | PRN
  Administered 2017-02-13 (×3): 1 via ORAL
  Filled 2017-02-12 (×3): qty 1

## 2017-02-12 MED ORDER — AZITHROMYCIN 250 MG PO TABS
1000.0000 mg | ORAL_TABLET | Freq: Once | ORAL | Status: AC
  Administered 2017-02-12: 1000 mg via ORAL
  Filled 2017-02-12: qty 4

## 2017-02-12 MED ORDER — MORPHINE SULFATE (PF) 2 MG/ML IV SOLN
1.0000 mg | INTRAVENOUS | Status: AC | PRN
  Administered 2017-02-12 (×3): 1 mg via INTRAVENOUS
  Filled 2017-02-12 (×3): qty 1

## 2017-02-12 MED ORDER — OXYCODONE-ACETAMINOPHEN 5-325 MG PO TABS
2.0000 | ORAL_TABLET | ORAL | Status: DC | PRN
  Administered 2017-02-12 (×3): 2 via ORAL
  Filled 2017-02-12 (×3): qty 2

## 2017-02-12 MED ORDER — DIBUCAINE 1 % RE OINT: 1.0000 "application " | TOPICAL_OINTMENT | RECTAL | Status: DC | PRN

## 2017-02-12 MED ORDER — PRENATAL MULTIVITAMIN CH
1.0000 | ORAL_TABLET | Freq: Every day | ORAL | Status: DC
  Administered 2017-02-12 – 2017-02-15 (×4): 1 via ORAL
  Filled 2017-02-12 (×4): qty 1

## 2017-02-12 MED ORDER — ACETAMINOPHEN 325 MG PO TABS
650.0000 mg | ORAL_TABLET | ORAL | Status: DC | PRN
  Administered 2017-02-14: 650 mg via ORAL
  Filled 2017-02-12: qty 2

## 2017-02-12 MED ORDER — DIPHENHYDRAMINE HCL 25 MG PO CAPS: 25.0000 mg | ORAL_CAPSULE | Freq: Four times a day (QID) | ORAL | Status: DC | PRN

## 2017-02-12 NOTE — Progress Notes (Signed)
RN in room to do initial assessment; pt laying flat and wanting pain medication; RN [facility] to [facility] to get used to moving in bed; pt declined; RN discussed with pt the benefit of moving in bed because later today the catheter would be removed and she would then be getting out of bed; RN wants pt to start sitting HOB up and down and moving legs; pt responds with " I need to rest first, I sat up back there and hurt really bad"

## 2017-02-12 NOTE — Clinical Social Work Note (Signed)
CSW consulted to see patient due to lack of support system and being 21 years old. Patient underwent a c-section shortly after midnight today. Due to this, CSW will defer assessment until tomorrow morning unless an emergent need arises to see patient.  York SpanielMonica Maliik Karner MSW,LCSW [phone]

## 2017-02-12 NOTE — Progress Notes (Signed)
Upon entering room after visitors had left, patient appeared sleepy and room was hazy. Concerns that patient had partaken in the use of illicit drugs with infant in the room. Patient feeding infant and request to take shower.

## 2017-02-12 NOTE — Transfer of Care (Signed)
Immediate Anesthesia Transfer of Care Note  Patient: Lacey Riggs  Procedure(s) Performed: Procedure(s): CESAREAN SECTION (N/A)  Patient Location: PACU and Mother/Baby  Anesthesia Type:Epidural  Level of Consciousness: awake, alert  and oriented  Airway & Oxygen Therapy: Patient Spontanous Breathing  Post-op Assessment: Report given to RN and Post -op Vital signs reviewed and stable  Post vital signs: Reviewed and stable  Last Vitals:  Vitals:   02/11/17 1403 02/12/17 0005  BP: 120/86 (!) 151/95  Pulse: 87 (!) 112  Resp:  20  Temp:      Last Pain:  Vitals:   02/11/17 1740  TempSrc:   PainSc: 0-No pain      Patients Stated Pain Goal: 0 (02/11/17 0950)  Complications: No apparent anesthesia complications

## 2017-02-12 NOTE — Progress Notes (Signed)
Dr. Valentino Saxonherry and Dr. Bard HerbertMoffit notified of possible events. To take infant to nursery and nursing supervisor to come speak with patient. Will continue to monitor.

## 2017-02-12 NOTE — Progress Notes (Signed)
Baby brought to mother's room at this time; RN discussed infant safety, safe sleep, feeding and bulb syringe; afterwards, pt says, "i'm too sleepy, who's gonna watch the baby?"; RN explained couplet care (getting used to the baby in the room, listening for baby's feeding cues); pt said again "i'm so tired"; RN did remind pt to push her call bell if she needed anything; RN also reminded pt that she and the NT would be coming in periodically to check on her; RN explained for her to just call for assistance as needed; RN discussed infant care and that we would assist and show her things (diaper changes, feedings) but she would need to be involved in infant care as well

## 2017-02-12 NOTE — Progress Notes (Signed)
Pt is currently a "private" patient ( "NO" in the hospital directory); pt had a password while in L&D; her boyfriend, her sister and her mother knew the password; as some point the pt changed the password; the pt did not want her mother to go into the SCN when the baby was transitioning and the pt's mother does NOT know the new password; RN alerted Engineer, materialssecurity officer (at 3rd floor elevators) of this situation and new password

## 2017-02-12 NOTE — Op Note (Signed)
02/12/2017 12:40 AM      [] Hide copied text   Cesarean Section Procedure Note  Indications: failure to progress: arrest of descent, failure to progress: arrest of dilation and non-reassuring fetal status Pt was being induced for oligohydramnios at term.  Began with cytotec and progress at 5 cm to Pitocin.  Augmentation was complicated by mild fetal intolerance to labor.  IUPC placed and coupling and occ stretches of inadequate contractions noted.  Pitocin was adjusted where baby could tolerate contractions but slow slope active phase was noted.  Pt stopped showing cervical change and no decent after reaching 9 cm.  The baby began having late decelerations and a decision for cesarean was made.                          OP NOTE   Name Lacey Riggs MR# [id number]  Preoperative Diagnosis: 1. Intrauterine pregnancy at [redacted]w[redacted]d Active Problems:   Labor and delivery, indication for care  2.  failure to progress: arrest of descent, failure to progress: arrest of dilation and non-reassuring fetal status  Postoperative Diagnosis: 1. Intrauterine pregnancy at [redacted]w[redacted]d, delivered 2. Viable infant 3. True CPD   Procedure: 1. Primary Low-Transverse Cesarean Section  Surgeon: Lacey Husky, MD  Assistant:  Thompson  Anesthesia: epidural  EBL: 1100 mL  Findings: 1. Female infant in the cephalic presentations.  2. Normal uterus, tubes and ovaries.    Procedure:  The patient was prepped and draped in the supine position and placed under spinal anesthesia.  A transverse incision was made across the abdomen in a Pfannenstiel manner. If indicated the old scar was systematically removed with sharp dissection.  We carried the dissection down to the level of the fascia.  The fascia was incised in a curvilinear manner.  The fascia was then elevated from the rectus muscles with blunt and sharp dissection.  The rectus muscles were separated laterally exposing the peritoneum.  The peritoneum  was carefully entered with care being taken to avoid bowel and bladder.  A self-retaining retractor was placed.  The visceral peritoneum was incised in a curvilinear fashion across the lower uterine segment creating a bladder flap. A transverse incision was made across the lower uterine segment and extended laterally and superiorly using the bandage scissors.  Artificial rupture membranes was performed and Clear fluid was noted.  The infant was delivered from the cephalic position.  A upper extremity arm cord was found and reduced.  The infant was immediately bulb suctioned.  The cord was doubly clamped and cut. Cord blood was obtained.  The infant was handed to the pediatrician who then placed the infant under heat lamps where it was cleaned dried and re-suctioned. The placenta was delivered. The hysterotomy incision was then identified on ring forceps.  The uterine cavity was cleaned with a moist lap sponge.  The hysterotomy incision was closed with a running interlocking suture of Vicryl.  Hemostasis was excellent.  Pitocin was run in the IV and the uterus was found to be firm. The posterior cul-de-sac and gutters were cleaned and inspected.  Hemostasis was noted.  The fascia was then closed with a running suture of #1 Vicryl.  Hemostasis of the subcutaneous tissues was obtained using the Bovie.  The subcutaneous tissues were closed with a running suture of 000 Vicryl.  A subcuticular suture was placed.  Steri-Strips were applied in the usual manner.  A pressure dressing was placed.  The patient went to  the recovery room in stable condition.   Lacey Riggs, M.D. 02/12/2017 12:41 AM

## 2017-02-12 NOTE — Anesthesia Post-op Follow-up Note (Signed)
Anesthesia QCDR form completed.        

## 2017-02-12 NOTE — Anesthesia Postprocedure Evaluation (Signed)
Anesthesia Post Note  Patient: Lacey BerkshireCasey R Riggs  Procedure(s) Performed: Procedure(s) (LRB): CESAREAN SECTION (N/A)  Patient location during evaluation: Mother Baby Anesthesia Type: Epidural Level of consciousness: awake and alert and oriented Pain management: pain level controlled Vital Signs Assessment: post-procedure vital signs reviewed and stable Respiratory status: spontaneous breathing Cardiovascular status: stable Postop Assessment: no headache and no backache Anesthetic complications: no     Last Vitals:  Vitals:   02/12/17 0547 02/12/17 0807  BP: 129/72 133/71  Pulse: (!) 113 (!) 105  Resp: 18 18  Temp: 37.1 C 37 C  SpO2: 98% 100%    Last Pain:  Vitals:   02/12/17 0807  TempSrc: Oral  PainSc:                  Rica MastBachich,  Kieron Kantner M

## 2017-02-12 NOTE — Progress Notes (Signed)
Patient ID: Lacey Riggs, female   DOB: 19-Aug-1995, 19 y.o.   MRN: [id number]   +++ Delayed note-rounds at 8:30 this morning.+++    Progress Note - Cesarean Delivery  Lacey BerkshireCasey R Stuck is a 21 y.o. G1P1001 now PP day 1 s/p C-Section, Low Vertical .   Subjective:  Patient reports no problems with eating, bowel movements, voiding, or their wound States that she is hungry like to eat. At this time seems to have little interest in her baby or baby care. Says that she is hungry and uncomfortable because of her pain.  Objective:  Vital signs in last 24 hours: Temp:  [98.1 F (36.7 C)-99.8 F (37.7 C)] 98.1 F (36.7 C) (08/09 1215) Pulse Rate:  [87-114] 99 (08/09 1215) Resp:  [16-20] 18 (08/09 1215) BP: (120-151)/(63-95) 132/63 (08/09 1215) SpO2:  [98 %-100 %] 100 % (08/09 0807)  Physical Exam:  General: alert and distracted Lochia: appropriate Uterine Fundus: firm Incision: Dressing intact DVT Evaluation: No evidence of DVT seen on physical exam.    Data Review  Recent Labs  02/10/17 1718 02/12/17 0415  HGB 11.3* 9.7*  HCT 32.4* 28.8*    Assessment:  Active Problems:   Labor and delivery, indication for care   Status post Cesarean section. Doing well postoperatively.   Patient seemed a little distracted and not interested in baby care. Routinely change the subject when trying to discuss her future activities wound care and continued hospital stay.  Plan:       Continue current care.  Strongly consider social service consult.  Elonda Huskyavid J. Evans, M.D. 02/12/2017 12:20 PM

## 2017-02-12 NOTE — Progress Notes (Signed)
Contacted by staff with concerns of patient possibly smoking marijuana. Spoke with patient. She denied smoking. Explained was a crime to use. Pediatrician had been contacted and baby is to go to nursery.

## 2017-02-12 NOTE — Progress Notes (Signed)
Progress Note - Cesarean Delivery  Lacey Riggs is a 21 y.o. G1P1001 now PP day 1 s/p C-Section, Low Vertical .   Subjective:  Patient reports no problems with eating, bowel movements, voiding, or her wound.   Objective:  Vital signs in last 24 hours: Temp:  [97.8 F (36.6 C)-99.8 F (37.7 C)] 98 F (36.7 C) (08/09 1955) Pulse Rate:  [69-114] 77 (08/09 1955) Resp:  [16-20] 20 (08/09 1955) BP: (117-151)/(60-95) 117/67 (08/09 1955) SpO2:  [98 %-100 %] 99 % (08/09 1955)  Physical Exam:  General: alert, cooperative, appears stated age and fatigued  Lungs: clear bilaterally Heart: [employer]R. Bowel sounds present, hypoactive. Has not passed gas or had bowel movement Lochia: appropriate Uterine Fundus: firm Incision: dressing dry and intact, dressing to be removed in A.M.  DVT Evaluation: No evidence of DVT seen on physical exam.    Data Review  Recent Labs  02/10/17 1718 02/12/17 0415  HGB 11.3* 9.7*  HCT 32.4* 28.8*    Assessment:  Active Problems:   Labor and delivery, indication for care   Status post Cesarean section. Doing well postoperatively. Encouraged pt to ambulate today. Patient and her partner informed of positive Chlamydia result and the need for her partner to be treated. They both verbalized understanding and agreed to plan.    Plan:       Continue current care.   Doreene Burkennie Travez Stancil, CNM 02/12/2017

## 2017-02-13 DIAGNOSIS — O9902 Anemia complicating childbirth: Secondary | ICD-10-CM

## 2017-02-13 DIAGNOSIS — D649 Anemia, unspecified: Secondary | ICD-10-CM

## 2017-02-13 LAB — SURGICAL PATHOLOGY

## 2017-02-13 LAB — URINE DRUG SCREEN, QUALITATIVE (ARMC ONLY)
AMPHETAMINES, UR SCREEN: NOT DETECTED
Barbiturates, Ur Screen: NOT DETECTED
Benzodiazepine, Ur Scrn: NOT DETECTED
CANNABINOID 50 NG, UR ~~LOC~~: NOT DETECTED
COCAINE METABOLITE, UR ~~LOC~~: NOT DETECTED
MDMA (ECSTASY) UR SCREEN: NOT DETECTED
Methadone Scn, Ur: NOT DETECTED
Opiate, Ur Screen: POSITIVE — AB
PHENCYCLIDINE (PCP) UR S: NOT DETECTED
TRICYCLIC, UR SCREEN: NOT DETECTED

## 2017-02-13 NOTE — Clinical Social Work Note (Signed)
Both patient's and patient's urine drug screens were negative for marijuana or any other illicit substance. Patient's urine drug screen was positive for opiates but that is due to her receiving percocet while in the hospital. Patient's nurse reports that patient has been caring for her newborn appropriately and bonding appropriately today. At this time there is nothing to provide evidence of need for a report to DSS Child Protective Services. York SpanielMonica Dennard Vezina MSW,MPH [phone]

## 2017-02-13 NOTE — Clinical Social Work Maternal (Signed)
  CLINICAL SOCIAL WORK MATERNAL/CHILD NOTE  Patient Details  Name: Lacey Riggs MRN: [id number] Date of Birth: 27-Mar-1996  Date:  02/13/2017  Clinical Social Worker Initiating Note:  Shela Leff MSW,LCSW Date/ Time Initiated:  02/13/17/      Child's Name:      Legal Guardian:  Mother   Need for Interpreter:  None   Date of Referral:        Reason for Referral:  Current Substance Use/Substance Use During Pregnancy    Referral Source:  RN   Address:     Phone number:      Household Members:  Significant Other   Natural Supports (not living in the home):  Immediate Family   Professional Supports: None   Employment: Full-time   Type of Work:     Education:      Pensions consultant:  [employer]   Other Resources:  [employer], Physicist, medical    Cultural/Religious Considerations Which May Impact Care:  none  Strengths:  Ability to meet basic needs    Risk Factors/Current Problems:  Substance Use    Cognitive State:  Alert , Able to Concentrate    Mood/Affect:  Calm    CSW Assessment: CSW consulted to see patient for concerns of support systems and being 21. CSW reviewed the record and read that last evening, nursing entered into patient's room and was overcome by a hazy substance in the air. Patient's eyes were bloodshot. CSW spoke with patient's nurse this morning and patient is caring for her newborn appropriately with no concerns. Patient's nurse informed CSW that when she spoke with patient this morning and discussed the events of last evening, patient stated that she was not smoking marijuana but her sister was smoking in the room. Patient was educated regarding these concerns  by patient's nurse. CSW   CSW met with patient and significant other in her room this morning. CSW explained role and purpose of visit. This is patient's first child. Patient reports that her current significant other is not the father of her newborn. She states that she has all necessities for  her newborn. She reports some concern with transportation but is aware that [facility] may be a potential resource. Patient denies any history of depression/anxiety even though she has had an inpatient stay several years ago status post argument with her mother. Patient moved out of that home when she was 18. Patient brought up the fact that she thinks the nursing staff thinks she is smoking pot in her room. Patient stated that no one was smoking in her room yesterday but that her sister does smoke marijuana and she must have smelled like it. CSW explained that we will await the drug test results from her newborn. Patient verbalized understanding.  CSW Plan/Description:  Patient/Family Education     [place], [place] 02/13/2017, 8:48 AM

## 2017-02-13 NOTE — Progress Notes (Signed)
Progress Note - Cesarean Delivery  Lacey BerkshireCasey R Ravelo is a 21 y.o. G1P1001 now PP day 2 s/p C-Section, Low Vertical .   Subjective:  Patient reports no problems with eating, bowel movements, voiding, or their wound No bowel movement and mild abdominal distention.   Objective:  Vital signs in last 24 hours: Temp:  [97.8 F (36.6 C)-99.8 F (37.7 C)] 99.8 F (37.7 C) (08/10 0732) Pulse Rate:  [69-126] 126 (08/10 0732) Resp:  [18-20] 18 (08/10 0732) BP: (117-138)/(60-80) 138/80 (08/10 0732) SpO2:  [99 %-100 %] 100 % (08/10 0732)  Physical Exam:  General: alert, cooperative, appears stated age and fatigued  Lungs clear bilaterally Heart: RRR Lochia: appropriate Uterine Fundus: firm Incision: bandage present, dry and intact. To be removed this morning by RN after medication given.  DVT Evaluation: No evidence of DVT seen on physical exam. No cords or calf tenderness.    Data Review  Recent Labs  02/10/17 1718 02/12/17 0415  HGB 11.3* 9.7*  HCT 32.4* 28.8*    Assessment:  Active Problems:   Labor and delivery, indication for care   Status post Cesarean section. Doing well postoperatively.     Plan:       Continue current care.  Plan for discharge tomorrow.  Doreene Burkennie Sherill Wegener, CNM 02/13/2017 7:42 AM

## 2017-02-14 MED ORDER — ACETAMINOPHEN 325 MG PO TABS: 650.0000 mg | ORAL_TABLET | ORAL | 0 refills | Status: DC | PRN

## 2017-02-14 MED ORDER — IBUPROFEN 600 MG PO TABS: 600.0000 mg | ORAL_TABLET | Freq: Four times a day (QID) | ORAL | 0 refills | Status: DC

## 2017-02-14 MED ORDER — OXYCODONE-ACETAMINOPHEN 5-325 MG PO TABS: 1.0000 | ORAL_TABLET | ORAL | 0 refills | Status: DC | PRN

## 2017-02-14 NOTE — Final Progress Note (Signed)
Discharge Day SOAP Note:  Progress Note - Vaginal Delivery  Lacey BerkshireCasey R Griebel is a 21 y.o. G1P1001 now PP day 3 s/p C-Section, Low Vertical . Delivery was uncomplicated  Subjective  The patient has the following complaints: has no unusual complaints  Pain is controlled with current medications.   Patient is urinating without difficulty.  She is ambulating well.    Objective  Vital signs: BP 127/81 (BP Location: Right Arm)   Pulse (!) 101   Temp 98 F (36.7 C) (Oral)   Resp 18   Ht 5\' 3"  (1.6 m)   Wt 209 lb (94.8 kg)   LMP 05/03/2016 (Approximate)   SpO2 99%   Breastfeeding? Unknown   BMI 37.02 kg/m   Physical Exam: Gen: NAD Fundus Fundal Tone: Firm  Lochia Amount: Small  Perineum Appearance: Intact     Data Review Labs: CBC Latest Ref Rng & Units 02/12/2017 02/10/2017 11/27/2016  WBC 3.6 - 11.0 K/uL 15.7(H) 9.8 -  Hemoglobin 12.0 - 16.0 g/dL 1.6(X9.7(L) 11.3(L) -  Hematocrit 35.0 - 47.0 % 28.8(L) 32.4(L) -  Platelets 150 - 440 K/uL 214 222 275   O POS  Assessment/Plan  Active Problems:   Labor and delivery, indication for care    Plan for discharge today.   Discharge Instructions: Per After Visit Summary. Activity: Advance as tolerated. Pelvic rest for 6 weeks.  Also refer to After Visit Summary Diet: Regular Medications: Allergies as of 02/14/2017   No Known Allergies     Medication List    TAKE these medications   acetaminophen 325 MG tablet Commonly known as:  TYLENOL Take 2 tablets (650 mg total) by mouth every 4 (four) hours as needed (for pain scale < 4).   ibuprofen 600 MG tablet Commonly known as:  ADVIL,MOTRIN Take 1 tablet (600 mg total) by mouth every 6 (six) hours.   multivitamin-prenatal 27-0.8 MG Tabs tablet Take 1 tablet by mouth daily at 12 noon.   oxyCODONE-acetaminophen 5-325 MG tablet Commonly known as:  PERCOCET/ROXICET Take 1-2 tablets by mouth every 4 (four) hours as needed (pain scale > 7).      Outpatient  follow up:  Postpartum contraception: nexplanon  Discharged Condition: good  Discharged to: home  Newborn Data: Disposition:home with mother  Apgars: APGAR (1 MIN): 2   APGAR (5 MINS): 5   APGAR (10 MINS): 7    Baby Feeding: Bottle    Doreene Burkennie Adolpho Meenach, CNM 02/14/2017 10:02 AM

## 2017-02-14 NOTE — Plan of Care (Signed)
Problem: Role Relationship: Goal: Ability to demonstrate positive interaction with newborn will improve Outcome: Progressing Good eye contact with Infant. Expressed concern that Infant was at the 4 hour mark for Formula feed and requested assistance in feeding. Holding Infant in blankets and asking apropriate baby care questions. V/O of Safe Sleep and Database administratornfant Safety and Security.

## 2017-02-14 NOTE — Discharge Summary (Addendum)
Discharge Summary  Date of Admission: 02/10/2017  Date of Discharge: 02/14/17  Admitting Diagnosis: Induction of labor at 1331w4d  Secondary Diagnosis: Oligohydramnios, Chlamydia infection  Mode of Delivery: low uterine, transverse     Discharge Diagnosis: Failed induction of labor and Reasons for cesarean section  Arrest of Dilation and Non-reassuring FHR   Intrapartum Procedures: Atificial rupture of membranes, epidural, GBS prophylaxis, pitocin augmentation and placement of intrauterine catheter   Post partum procedures: Treament for chlamydia   Complications: none                      Discharge Day SOAP Note:  Progress Note - Vaginal Delivery  Lacey Riggs is a 21 y.o. G1P1001 now PP day 3 s/p C-Section, Low Vertical . Delivery was uncomplicated  Subjective  The patient has the following complaints: has no unusual complaints  Pain is controlled with current medications.   Patient is urinating without difficulty.  She is ambulating well.    Objective  Vital signs: BP 127/81 (BP Location: Right Arm)   Pulse (!) 101   Temp 98 F (36.7 C) (Oral)   Resp 18   Ht 5\' 3"  (1.6 m)   Wt 209 lb (94.8 kg)   LMP 05/03/2016 (Approximate)   SpO2 99%   Breastfeeding? Unknown   BMI 37.02 kg/m   Physical Exam: Gen: NAD Fundus Fundal Tone: Firm  Lochia Amount: Small  Perineum Appearance: Intact     Data Review Labs: CBC Latest Ref Rng & Units 02/12/2017 02/10/2017 11/27/2016  WBC 3.6 - 11.0 K/uL 15.7(H) 9.8 -  Hemoglobin 12.0 - 16.0 g/dL 1.1(B9.7(L) 11.3(L) -  Hematocrit 35.0 - 47.0 % 28.8(L) 32.4(L) -  Platelets 150 - 440 K/uL 214 222 275   O POS  Assessment/Plan  Active Problems:   Labor and delivery, indication for care    Plan for discharge today.   Discharge Instructions: Per After Visit Summary. Activity: Advance as tolerated. Pelvic rest for 6 weeks.  Also refer to After Visit Summary Diet: Regular Medications: Allergies as of  02/14/2017   No Known Allergies     Medication List    TAKE these medications   acetaminophen 325 MG tablet Commonly known as:  TYLENOL Take 2 tablets (650 mg total) by mouth every 4 (four) hours as needed (for pain scale < 4).   ibuprofen 600 MG tablet Commonly known as:  ADVIL,MOTRIN Take 1 tablet (600 mg total) by mouth every 6 (six) hours.   multivitamin-prenatal 27-0.8 MG Tabs tablet Take 1 tablet by mouth daily at 12 noon.   oxyCODONE-acetaminophen 5-325 MG tablet Commonly known as:  PERCOCET/ROXICET Take 1-2 tablets by mouth every 4 (four) hours as needed (pain scale > 7).      Outpatient follow up:  Postpartum contraception: nexplanon  Discharged Condition: good  Discharged to: home  Newborn Data: Disposition:home with mother  Apgars: APGAR (1 MIN): 2   APGAR (5 MINS): 5   APGAR (10 MINS): 7    Baby Feeding: Bottle    Doreene Burkennie Thompson, CNM 02/14/2017 10:02 AM

## 2017-02-14 NOTE — Lactation Note (Signed)
This note was copied from a baby's chart. Lactation Consultation Note Mom was considering pumping to give breast milk instead of formula.  Discussed with mom supply and demand and need to pump every 2 to 3 hours to bring in mature milk, ensure a plentiful supply and prevent engorgement when mature milk transitions.  Mom not sure if wanted to make that commitment.  Lactation name and number written on white board and encouraged to call if she decided to pump.   Patient Name: Boy Lindaann SloughCasey Mccree ZHYQM'VToday's Date: 02/14/2017     Maternal Data    Feeding Feeding Type: Bottle Fed - Formula Nipple Type: Slow - flow  LATCH Score                   Interventions    Lactation Tools Discussed/Used     Consult Status      Louis MeckelWilliams, Avril Busser Kay 02/14/2017, 3:22 PM

## 2017-02-14 NOTE — Progress Notes (Signed)
Mom expressing consideration of placing Infant up for adoption. Mom is alert and oriented with teary affect. Mom has been appropriate with Infant and has been providing Infant care as well as asking appropriate Baby Care questions. She is teary now and has stated," I'm not ready for a baby." " I have to work and so does the Dad and I don't have transportation." She expresses concern that there is little support for her in any degree. [facility] so that she could have respite. I ask her if she would like to speak to [employer] [facility] and she said yes. I have placed a STAT Consult with Spirit[facility] Services and I have urged her to call me if she wishes to talk further or needs something in the mean time. WIll cont to support this Mom.

## 2017-02-15 DIAGNOSIS — D649 Anemia, unspecified: Secondary | ICD-10-CM

## 2017-02-15 DIAGNOSIS — O9902 Anemia complicating childbirth: Secondary | ICD-10-CM

## 2017-02-15 MED ORDER — VARICELLA VIRUS VACCINE LIVE 1350 PFU/0.5ML IJ SUSR
0.5000 mL | Freq: Once | INTRAMUSCULAR | Status: AC
  Administered 2017-02-15: 0.5 mL via SUBCUTANEOUS
  Filled 2017-02-15 (×3): qty 0.5

## 2017-02-15 MED ORDER — FUSION PLUS PO CAPS: 1.0000 | ORAL_CAPSULE | Freq: Every day | ORAL | 1 refills | Status: DC

## 2017-02-15 NOTE — Progress Notes (Signed)
NB in to visit with pt at pt's request.  BF also present

## 2017-02-15 NOTE — Progress Notes (Signed)
Infant taken back to Mom's room and Mom asked, " Are you going to have someone talk to me today?"  I asked her if she was referring to adoption and she stated," Yes."  She also stated that she did not want to, " Get any closer to him." Referring to the Infant. She asked me to take him back to the [facility] and have Child psychotherapistocial Worker in to speak with her today. Westley Foots. Grubbs RN given Report and AC notified.

## 2017-02-15 NOTE — Progress Notes (Signed)
Talked with [facility]Karen, [facility].  Informed of stat consult for possible adoption.    Brief social history given to SW

## 2017-02-15 NOTE — Progress Notes (Signed)
SW into see pt.

## 2017-02-15 NOTE — Progress Notes (Signed)
Chaplain received an Order Requisition that patient needed to talk. Patient had concerns about her putting her child up for adoption. Patient was very tired and had falling asleep by the time Chaplain arrived. Nurse said she would called me or page me once the patient woke up instead of putting in an order.

## 2017-02-15 NOTE — Progress Notes (Signed)
Left message for weekend SW Clydie BraunKaren to call [phone] for consult, ie. Possible adoption

## 2017-02-15 NOTE — Clinical Social Work Note (Signed)
CSW placed call to CSW supervisor to alert of possible Safe Surrender protocol due to the MOB not having initiated adoption proceedings through DSS or a private agency. CSW will assess when able.  Argentina PonderKaren Martha Kionna Brier, MSW, Theresia MajorsLCSWA [phone]

## 2017-02-15 NOTE — Clinical Social Work Note (Signed)
CSW received consult that the patient is "thinking about adoption." The CSW met with the patient and her boyfriend, Demarion Jones, at bedside to discuss the patient's options. The patient reports that Demarion is not the FOB; she indicated that she visited a fertility clinic in [place] on an impulse to become pregnant while Demarion was incarcerated. The CSW asked if the patient would like her boyfriend to leave the room during the discussion, and the patient reported that she wanted him to stay. The patient's affect was variable between anxious and tearful. The patient reported "I just don't want it (the baby)." The CSW inquired about how the patient paid for fertility treatments. 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 The patient stated "I thought about it and never did."   The CSW then asked if the patient had mentioned her concerns to the Burke who assessed her on 02/13/2017. The patient indicated that she did not remember a CSW speaking to her. The CSW described the initial CSW, and the patient reported "I kind of remember her. No, I didn't tell anybody until last night. But, I've been thinking about it."  The CSW contacted the on-call CPS worker, Annalee Genta, who took the report. According to Annalee Genta, she will be arriving at [facility] today to attempt to speak to the MOB to gain more information as both this CSW and the CPS worker doubt the validity of the patient receiving fertility treatments as a) no such information is in her medical chart b) fertility procedures are intensive and not an impulsive activity and c) the patient has been evasive often in the assessment.   CSW will continue to follow to assist in any way with CPS.  Santiago Bumpers, MSW, Latanya Presser [phone]

## 2017-02-15 NOTE — Progress Notes (Signed)
[facility] here to see Pt. And Pt. Was sleeping so will come back to sonsult with pt. When awake if Pt. Still desires.

## 2017-02-15 NOTE — Progress Notes (Signed)
Incision Care kit given for home use.

## 2017-02-15 NOTE — Progress Notes (Signed)
DSS worker here to talk with pt about her decision.

## 2017-02-15 NOTE — Progress Notes (Signed)
[facility].  Final decision from Adoption Case Worker and Pt is for pt to call Adoption Case Worker tomorrow morning at DSS with her final decision.  Patient has the number she is to call.   Adoption Case Worker will notify NB Nsy in the am with mom's decision and disposition of NB.  Discharge instr reviewed with pt.  Rx given for home use. Phone number given to [facility] for her to make appointment for f/u for Thursday August 15.  Pt verb u/o  Ambulated to car with BF and nurse.  Tolerated walk well.  Pt's sister here to pick her up.  Pt calm and cooperative upon leaving.

## 2017-02-15 NOTE — Discharge Summary (Signed)
Obstetric Discharge Summary Reason for Admission: onset of labor    H/H:  Lab Results  Component Value Date/Time   HGB 9.7 (L) 02/12/2017 04:15 AM   HGB 11.4 10/31/2016   HCT 28.8 (L) 02/12/2017 04:15 AM    Discharge Diagnoses: Term Pregnancy-delivered and FTP, +CMZ on admission  Discharge Information: Date: 02/15/2017 Activity: pelvic rest Diet: routine Baby feeding: NA as female infant surrendered for adoption Contraception: Nexplanon Medications: PNV, Tylenol #3, Ibuprofen, Colace and Iron Condition: stable Instructions: refer to practice specific booklet Discharge to: home   Mettie Roylance BeedevilleShambley, CNM 02/15/2017,10:06 AM

## 2017-02-15 NOTE — Progress Notes (Signed)
DSS Adoption staff here to talk with patient about her different options

## 2017-02-16 NOTE — Discharge Summary (Signed)
Obstetric Discharge Summary  Patient ID: GRADY LUCCI MRN: [id number] DOB/AGE: 12-Sep-1995 21 y.o.   Date of Admission: 02/04/2017 Lacey Riggs, CNM Horton Marshall, MD)  Date of Discharge: 02/04/2017 Lacey Riggs, CNM Horton Marshall, MD)  Admitting Diagnosis: Observation at [redacted]w[redacted]d  Secondary Diagnosis: Limited, late prenatal care     Discharge Diagnosis: No other diagnosis  Antepartum Procedures: NST and ROM plus  Brief Hospital Course   L&D OB Triage Note  Lacey Riggs is a 21 y.o. G34P1001 female at [redacted]w[redacted]d, EDD Estimated Date of Delivery: 02/07/17 who presented to triage for complaints of irregular uterine contractions and leakage of fluid for the last week.   Denies vaginal bleeding. Endorses good fetal movement.   Denies difficulty breathing or respiratory distress, chest pain, abdominal pain, and leg pain or swelling.   Seen at [facility] Department for four (4) prenatal visits, never officially transferred to [facility] for care.   Objective:  BP 124/76 (BP Location: Left Arm)   Pulse (!) 109   Temp 98.3 F (36.8 C)   Resp 18   Ht 5\' 3"  (1.6 m)   Wt 180 lb (81.6 kg)   LMP 05/03/2016 (Approximate)   BMI 31.89 kg/m   General: alert and oriented x 4, no apparent distress.   Lungs: CTAB  Heart: Regular rate and rhythm  Abdomen: soft, round, non-tender, gravid, S=D  Lower extremities: no edema or pain  SVE: declined  Labs:   Urinalysis, Complete w Microscopic     Status: Abnormal   Collection Time: 02/04/17 10:55 AM  Result Value Ref Range   Color, Urine YELLOW (A) YELLOW   APPearance CLEAR (A) CLEAR   Specific Gravity, Urine 1.021 1.005 - 1.030   pH 6.0 5.0 - 8.0   Glucose, UA NEGATIVE NEGATIVE mg/dL   Hgb urine dipstick NEGATIVE NEGATIVE   Bilirubin Urine NEGATIVE NEGATIVE   Ketones, ur NEGATIVE NEGATIVE mg/dL   Protein, ur NEGATIVE NEGATIVE mg/dL   Nitrite NEGATIVE NEGATIVE   Leukocytes, UA SMALL (A) NEGATIVE   RBC / HPF 0-5 0 - 5 RBC/hpf   WBC, UA 6-30 0 - 5 WBC/hpf   Bacteria, UA NONE SEEN NONE SEEN   Squamous Epithelial / LPF 0-5 (A) NONE SEEN   Mucous PRESENT   Urine Drug Screen, Qualitative ([facility] only)     Status: None   Collection Time: 02/04/17 10:58 AM  Result Value Ref Range   Tricyclic, Ur Screen NONE DETECTED NONE DETECTED   Amphetamines, Ur Screen NONE DETECTED NONE DETECTED   MDMA (Ecstasy)Ur Screen NONE DETECTED NONE DETECTED   Cocaine Metabolite,Ur [place] NONE DETECTED NONE DETECTED   Opiate, Ur Screen NONE DETECTED NONE DETECTED   Phencyclidine (PCP) Ur S NONE DETECTED NONE DETECTED   Cannabinoid 50 Ng, Ur [place] NONE DETECTED NONE DETECTED   Barbiturates, Ur Screen NONE DETECTED NONE DETECTED   Benzodiazepine, Ur Scrn NONE DETECTED NONE DETECTED   Methadone Scn, Ur NONE DETECTED NONE DETECTED    Comment: (NOTE) 100  Tricyclics, urine               Cutoff 1000 ng/mL 200  Amphetamines, urine             Cutoff 1000 ng/mL 300  MDMA (Ecstasy), urine           Cutoff 500 ng/mL 400  Cocaine Metabolite, urine       Cutoff 300 ng/mL 500  Opiate, urine  Cutoff 300 ng/mL 600  Phencyclidine (PCP), urine      Cutoff 25 ng/mL 700  Cannabinoid, urine              Cutoff 50 ng/mL 800  Barbiturates, urine             Cutoff 200 ng/mL 900  Benzodiazepine, urine           Cutoff 200 ng/mL 1000 Methadone, urine                Cutoff 300 ng/mL 1100 1200 The urine drug screen provides only a preliminary, unconfirmed 1300 analytical test result and should not be used for non-medical 1400 purposes. Clinical consideration and professional judgment should 1500 be applied to any positive drug screen result due to possible 1600 interfering substances. A more specific alternate chemical method 1700 must be used in order to obtain a confirmed analytical result.  1800 Gas chromato graphy / mass spectrometry (GC/MS) is the preferred 1900 confirmatory method.   ROM Plus ([facility] only)     Status: None   Collection Time:  02/04/17 11:10 AM  Result Value Ref Range   Rom Plus NEGATIVE    Fetal Assessment:  Mode: External Baseline Rate (A): 145 bpm Variability: Moderate Accelerations: 15 x 15 Decelerations: None   Contraction Frequency (min): occasional  Assessment  21 year old G1P0 at 961w4d, by second trimester ultrasound, NOT IN LABOR, braxton hicks contractions  FHR Category I-Reactive NST  Plan:  NST performed was reviewed and was found to be reactive. She was discharged home with bleeding/labor precautions.  Continue routine prenatal care. Follow up with CNM as previously scheduled.    Discharge Instructions: Per After Visit Summary.  Activity: Refer to After Visit Summary.   Diet: Regular  Medications: Allergies as of 02/04/2017   No Known Allergies     Medication List    You have not been prescribed any medications.    Outpatient follow up:  Follow-up Information    Gunnar BullaLawhorn, Goyal Michelle, CNM Follow up in 1 day(s).   Specialties:  Certified Nurse Midwife, Obstetrics and Gynecology, Radiology Why:  Appointment scheduled 02/05/2017 1230 with Lacey RoyalsMichelle Sreekar Broyhill, CNM Contact information: [address] [place] [address] [phone]          Postpartum contraception: Nexplanon  Discharged Condition: stable  Discharged to: home   Gunnar BullaJenkins Michelle Aella Ronda, [place]
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
## 2017-02-19 ENCOUNTER — Encounter: Payer: Medicaid Other | Admitting: Certified Nurse Midwife

## 2017-02-20 ENCOUNTER — Encounter: Payer: Self-pay | Admitting: Certified Nurse Midwife

## 2017-02-20 ENCOUNTER — Ambulatory Visit (INDEPENDENT_AMBULATORY_CARE_PROVIDER_SITE_OTHER): Payer: Medicaid Other | Admitting: Certified Nurse Midwife

## 2017-02-20 VITALS — BP 123/94 | HR 105 | Wt 180.3 lb

## 2017-02-20 DIAGNOSIS — Z4889 Encounter for other specified surgical aftercare: Secondary | ICD-10-CM

## 2017-02-20 DIAGNOSIS — Z30017 Encounter for initial prescription of implantable subdermal contraceptive: Secondary | ICD-10-CM | POA: Diagnosis not present

## 2017-02-20 DIAGNOSIS — Z98891 History of uterine scar from previous surgery: Secondary | ICD-10-CM

## 2017-02-20 NOTE — Progress Notes (Signed)
Patient comes in today to have nexplanon inserted. She feels well today with no other complaints.

## 2017-02-20 NOTE — Patient Instructions (Signed)
Nexplanon Instructions After Insertion   Keep bandage clean and dry for 24 hours   May use ice/Tylenol/Ibuprofen for soreness or pain   If you develop fever, drainage or increased warmth from incision site-contact office immediately  Etonogestrel implant What is this medicine? ETONOGESTREL (et oh noe JES trel) is a contraceptive (birth control) device. It is used to prevent pregnancy. It can be used for up to 3 years. This medicine may be used for other purposes; ask your health care provider or pharmacist if you have questions. COMMON BRAND NAME(S): Implanon, Nexplanon What should I tell my health care provider before I take this medicine? They need to know if you have any of these conditions: -abnormal vaginal bleeding -blood vessel disease or blood clots -cancer of the breast, cervix, or liver -depression -diabetes -gallbladder disease -headaches -heart disease or recent heart attack -high blood pressure -high cholesterol -kidney disease -liver disease -renal disease -seizures -tobacco smoker -an unusual or allergic reaction to etonogestrel, other hormones, anesthetics or antiseptics, medicines, foods, dyes, or preservatives -pregnant or trying to get pregnant -breast-feeding How should I use this medicine? This device is inserted just under the skin on the inner side of your upper arm by a health care professional. Talk to your pediatrician regarding the use of this medicine in children. Special care may be needed. Overdosage: If you think you have taken too much of this medicine contact a poison control center or emergency room at once. NOTE: This medicine is only for you. Do not share this medicine with others. What if I miss a dose? This does not apply. What may interact with this medicine? Do not take this medicine with any of the following medications: -amprenavir -bosentan -fosamprenavir This medicine may also interact with the following  medications: -barbiturate medicines for inducing sleep or treating seizures -certain medicines for fungal infections like ketoconazole and itraconazole -grapefruit juice -griseofulvin -medicines to treat seizures like carbamazepine, felbamate, oxcarbazepine, phenytoin, topiramate -modafinil -phenylbutazone -rifampin -rufinamide -some medicines to treat HIV infection like atazanavir, indinavir, lopinavir, nelfinavir, tipranavir, ritonavir -St. John's wort This list may not describe all possible interactions. Give your health care provider a list of all the medicines, herbs, non-prescription drugs, or dietary supplements you use. Also tell them if you smoke, drink alcohol, or use illegal drugs. Some items may interact with your medicine. What should I watch for while using this medicine? This product does not protect you against HIV infection (AIDS) or other sexually transmitted diseases. You should be able to feel the implant by pressing your fingertips over the skin where it was inserted. Contact your doctor if you cannot feel the implant, and use a non-hormonal birth control method (such as condoms) until your doctor confirms that the implant is in place. If you feel that the implant may have broken or become bent while in your arm, contact your healthcare provider. What side effects may I notice from receiving this medicine? Side effects that you should report to your doctor or health care professional as soon as possible: -allergic reactions like skin rash, itching or hives, swelling of the face, lips, or tongue -breast lumps -changes in emotions or moods -depressed mood -heavy or prolonged menstrual bleeding -pain, irritation, swelling, or bruising at the insertion site -scar at site of insertion -signs of infection at the insertion site such as fever, and skin redness, pain or discharge -signs of pregnancy -signs and symptoms of a blood clot such as breathing problems; changes in  vision; chest pain; severe,   sudden headache; pain, swelling, warmth in the leg; trouble speaking; sudden numbness or weakness of the face, arm or leg -signs and symptoms of liver injury like dark yellow or brown urine; general ill feeling or flu-like symptoms; light-colored stools; loss of appetite; nausea; right upper belly pain; unusually weak or tired; yellowing of the eyes or skin -unusual vaginal bleeding, discharge -signs and symptoms of a stroke like changes in vision; confusion; trouble speaking or understanding; severe headaches; sudden numbness or weakness of the face, arm or leg; trouble walking; dizziness; loss of balance or coordination Side effects that usually do not require medical attention (report to your doctor or health care professional if they continue or are bothersome): -acne -back pain -breast pain -changes in weight -dizziness -general ill feeling or flu-like symptoms -headache -irregular menstrual bleeding -nausea -sore throat -vaginal irritation or inflammation This list may not describe all possible side effects. Call your doctor for medical advice about side effects. You may report side effects to FDA at 1-800-FDA-1088. Where should I keep my medicine? This drug is given in a hospital or clinic and will not be stored at home. NOTE: This sheet is a summary. It may not cover all possible information. If you have questions about this medicine, talk to your doctor, pharmacist, or health care provider.  2018 Elsevier/Gold Standard (2016-01-10 11:19:22)  

## 2017-03-01 DIAGNOSIS — Z98891 History of uterine scar from previous surgery: Secondary | ICD-10-CM | POA: Insufficient documentation

## 2017-03-01 MED ORDER — ETONOGESTREL 68 MG ~~LOC~~ IMPL
68.0000 mg | DRUG_IMPLANT | Freq: Once | SUBCUTANEOUS | Status: AC
  Administered 2017-02-20: 68 mg via SUBCUTANEOUS

## 2017-03-01 NOTE — Progress Notes (Signed)
OBSTETRICS/GYNECOLOGY POST-OPERATIVE CLINIC VISIT  Subjective:     Lacey Riggs is a 21 y.o. female who presents to the clinic 1 weeks status post primary c-section for arrest of dilation. Eating a regular diet without difficulty. Bowel movements are normal. The patient is not having any pain.  The following portions of the patient's history were reviewed and updated as appropriate:  She  has a past medical history of Anemia.   She  does not have any pertinent problems on file.   Her family history includes Anemia in her mother; Diabetes in her paternal grandmother; Hypertension in her paternal grandmother.   She  reports that she has never smoked. She has never used smokeless tobacco. She reports that she does not drink alcohol or use drugs.    Current Outpatient Prescriptions on File Prior to Visit  Medication Sig Dispense Refill  . acetaminophen (TYLENOL) 325 MG tablet Take 2 tablets (650 mg total) by mouth every 4 (four) hours as needed (for pain scale < 4). 30 tablet 0  . ibuprofen (ADVIL,MOTRIN) 600 MG tablet Take 1 tablet (600 mg total) by mouth every 6 (six) hours. 30 tablet 0  . Iron-FA-B Cmp-C-Biot-Probiotic (FUSION PLUS) CAPS Take 1 capsule by mouth daily. 60 capsule 1  . oxyCODONE-acetaminophen (PERCOCET/ROXICET) 5-325 MG tablet Take 1-2 tablets by mouth every 4 (four) hours as needed (pain scale > 7). 30 tablet 0  . Prenatal Vit-Fe Fumarate-FA (MULTIVITAMIN-PRENATAL) 27-0.8 MG TABS tablet Take 1 tablet by mouth daily at 12 noon.     No current facility-administered medications on file prior to visit.   .  Review of Systems Pertinent items are noted in HPI.    Objective:    BP (!) 123/94 (BP Location: Right Arm, Patient Position: Sitting, Cuff Size: Normal)   Pulse (!) 105   Wt 180 lb 4.8 oz (81.8 kg)   LMP 05/03/2016 (Approximate)   BMI 31.94 kg/m    General:  alert and no distress  Abdomen: soft, bowel sounds active, non-tender  Incision:   healing  well, no drainage, no erythema, no dehiscence, incision well approximated    Assessment:    Doing well postoperatively.   Plan:   1. Continue any current medications. 2. Wound care discussed. 3. Operative findings again reviewed. Pathology report discussed. 4. Activity restrictions: no lifting more than 25 pounds 5. Anticipated return to work: four (4) to five (5). 6. Follow up: 5 weeks for postpartum visit.    Gunnar Bulla, CNM [facility]    Nexplanon Insertion (Procedure Note)  Lacey Riggs is a 21 y.o. year old Caucasian female here for Nexplanon insertion.  Patient's last menstrual period was 05/03/2016 (approximate)., patient is one (1) week postpartum.    Risks/benefits/side effects of Nexplanon have been discussed and her questions have been answered.  Specifically, a failure rate of 07/998 has been reported, with an increased failure rate if pt takes St. John's Wort and/or antiseizure medicaitons.    Lacey Riggs is aware of the common side effect of irregular bleeding, which the incidence of decreases over time.  BP (!) 123/94 (BP Location: Right Arm, Patient Position: Sitting, Cuff Size: Normal)   Pulse (!) 105   Wt 180 lb 4.8 oz (81.8 kg)   LMP 05/03/2016 (Approximate)   BMI 31.94 kg/m   She is right-handed, so her left arm, approximately 4 inches proximal from the elbow, was cleansed with alcohol and anesthetized with 2cc of 2% Lidocaine.  The  area was cleansed again with betadine and the Nexplanon was inserted per manufacturer's recommendations without difficulty.  A steri-strip and pressure bandage were applied.  Pt was instructed to keep the area clean and dry, remove pressure bandage in 24 hours, and keep insertion site covered with the steri-strip for 3-5 days.  Back up contraception was recommended for 2 weeks.  She was given a card indicating date Nexplanon was inserted and date it needs to be removed.   Follow-up PRN  problems.   Lacey Riggs,CNM

## 2017-03-02 ENCOUNTER — Telehealth: Payer: Self-pay | Admitting: Certified Nurse Midwife

## 2017-03-02 NOTE — Telephone Encounter (Signed)
I completed this paperwork on 02/24/2018. Faxed with confirmation to her employer. I tried to reach pt but her cell phone the vm was full and her home number was not available. Pt wants to go back to work asap. I gave her 8 weeks.

## 2017-03-02 NOTE — Telephone Encounter (Signed)
LM with Demara (BF?) for pt to contact office.

## 2017-03-02 NOTE — Telephone Encounter (Signed)
Patient must wait until six (6) week postpartum visit for return to work clearance. Thanks, JML

## 2017-03-02 NOTE — Telephone Encounter (Signed)
Patient called asking to speak to [name], And also stated that she needs her paperwork to return to work. The patient was informed that we have a 24 hr turn around time policy. The patient did not disclose any other information, and wanted another note sent back.

## 2017-03-02 NOTE — Telephone Encounter (Signed)
Pt is requesting for a note to return to work asap/ S/P c/s 02/11/2017. I have sent in her FMLA paper on 02/24/2018 with a return to work on 04/14/2017. Can she go back now or wait until at least 6 weeks pp?

## 2017-03-02 NOTE — Telephone Encounter (Signed)
Do you have this FMLA paperwork? JML

## 2017-03-02 NOTE — Telephone Encounter (Signed)
Patient states that she emailed you paperwork to your personal email so that she can return to work and she hasn't heard back from you. She wants to know if she can return to work asap.  Please call

## 2017-03-03 NOTE — Telephone Encounter (Signed)
Pt returned call. Pt was advised as below. Pt stated that she would have to get a new job and hung up. Please advise. Thanks TNP

## 2017-03-03 NOTE — Telephone Encounter (Signed)
Spoke with BF. He states he is at work. I would have to call before 3pm if I wanted to speak with Baird Lyons. Asked him to have her call me.

## 2017-03-05 NOTE — Telephone Encounter (Signed)
BF states they are not together anymore. Asked for a different number to contact pt. He states He will have to call me back with a number.

## 2017-04-07 ENCOUNTER — Telehealth: Payer: Self-pay | Admitting: Certified Nurse Midwife

## 2017-04-07 NOTE — Telephone Encounter (Signed)
Patient needs paperwork that she sent to you via email   Please call as soon as possible - she needs it to return to work

## 2017-04-08 NOTE — Telephone Encounter (Signed)
Called mobile number and man gave me a number were Zuleyka could be reached- [phone]. Called the number and a female answered and hung up. Will try later.

## 2017-04-09 ENCOUNTER — Ambulatory Visit (INDEPENDENT_AMBULATORY_CARE_PROVIDER_SITE_OTHER): Payer: Medicaid Other | Admitting: Certified Nurse Midwife

## 2017-04-09 ENCOUNTER — Encounter: Payer: Self-pay | Admitting: Certified Nurse Midwife

## 2017-04-09 NOTE — Telephone Encounter (Signed)
vm not set up yet. Will try later.

## 2017-04-09 NOTE — Patient Instructions (Signed)
Etonogestrel implant What is this medicine? ETONOGESTREL (et oh noe JES trel) is a contraceptive (birth control) device. It is used to prevent pregnancy. It can be used for up to 3 years. This medicine may be used for other purposes; ask your health care provider or pharmacist if you have questions. COMMON BRAND NAME(S): Implanon, Nexplanon What should I tell my health care provider before I take this medicine? They need to know if you have any of these conditions: -abnormal vaginal bleeding -blood vessel disease or blood clots -cancer of the breast, cervix, or liver -depression -diabetes -gallbladder disease -headaches -heart disease or recent heart attack -high blood pressure -high cholesterol -kidney disease -liver disease -renal disease -seizures -tobacco smoker -an unusual or allergic reaction to etonogestrel, other hormones, anesthetics or antiseptics, medicines, foods, dyes, or preservatives -pregnant or trying to get pregnant -breast-feeding How should I use this medicine? This device is inserted just under the skin on the inner side of your upper arm by a health care professional. Talk to your pediatrician regarding the use of this medicine in children. Special care may be needed. Overdosage: If you think you have taken too much of this medicine contact a poison control center or emergency room at once. NOTE: This medicine is only for you. Do not share this medicine with others. What if I miss a dose? This does not apply. What may interact with this medicine? Do not take this medicine with any of the following medications: -amprenavir -bosentan -fosamprenavir This medicine may also interact with the following medications: -barbiturate medicines for inducing sleep or treating seizures -certain medicines for fungal infections like ketoconazole and itraconazole -grapefruit juice -griseofulvin -medicines to treat seizures like carbamazepine, felbamate, oxcarbazepine,  phenytoin, topiramate -modafinil -phenylbutazone -rifampin -rufinamide -some medicines to treat HIV infection like atazanavir, indinavir, lopinavir, nelfinavir, tipranavir, ritonavir -St. John's wort This list may not describe all possible interactions. Give your health care provider a list of all the medicines, herbs, non-prescription drugs, or dietary supplements you use. Also tell them if you smoke, drink alcohol, or use illegal drugs. Some items may interact with your medicine. What should I watch for while using this medicine? This product does not protect you against HIV infection (AIDS) or other sexually transmitted diseases. You should be able to feel the implant by pressing your fingertips over the skin where it was inserted. Contact your doctor if you cannot feel the implant, and use a non-hormonal birth control method (such as condoms) until your doctor confirms that the implant is in place. If you feel that the implant may have broken or become bent while in your arm, contact your healthcare provider. What side effects may I notice from receiving this medicine? Side effects that you should report to your doctor or health care professional as soon as possible: -allergic reactions like skin rash, itching or hives, swelling of the face, lips, or tongue -breast lumps -changes in emotions or moods -depressed mood -heavy or prolonged menstrual bleeding -pain, irritation, swelling, or bruising at the insertion site -scar at site of insertion -signs of infection at the insertion site such as fever, and skin redness, pain or discharge -signs of pregnancy -signs and symptoms of a blood clot such as breathing problems; changes in vision; chest pain; severe, sudden headache; pain, swelling, warmth in the leg; trouble speaking; sudden numbness or weakness of the face, arm or leg -signs and symptoms of liver injury like dark yellow or brown urine; general ill feeling or flu-like symptoms;  light-colored   stools; loss of appetite; nausea; right upper belly pain; unusually weak or tired; yellowing of the eyes or skin -unusual vaginal bleeding, discharge -signs and symptoms of a stroke like changes in vision; confusion; trouble speaking or understanding; severe headaches; sudden numbness or weakness of the face, arm or leg; trouble walking; dizziness; loss of balance or coordination Side effects that usually do not require medical attention (report to your doctor or health care professional if they continue or are bothersome): -acne -back pain -breast pain -changes in weight -dizziness -general ill feeling or flu-like symptoms -headache -irregular menstrual bleeding -nausea -sore throat -vaginal irritation or inflammation This list may not describe all possible side effects. Call your doctor for medical advice about side effects. You may report side effects to FDA at 1-800-FDA-1088. Where should I keep my medicine? This drug is given in a hospital or clinic and will not be stored at home. NOTE: This sheet is a summary. It may not cover all possible information. If you have questions about this medicine, talk to your doctor, pharmacist, or health care provider.  2018 Elsevier/Gold Standard (2016-01-10 11:19:22)  

## 2017-04-09 NOTE — Progress Notes (Signed)
Subjective:    Lacey Riggs is a 21 y.o. G44P1001 Caucasian female who presents for a postpartum visit.   She is 8 weeks postpartum following a primary cesarean section, low transverse incision at 40+5 gestational weeks. Anesthesia: spinal. I have fully reviewed the prenatal and intrapartum course.   Postpartum course has been complicated by STI exposure. Baby's course is unknown, currently in the custody of [facility]. Bleeding changing a tampon 2 times a day. Bowel function is normal. Bladder function is normal.   Patient is sexually active. Contraception method is Nexplanonplaced on 02/20/2017. Postpartum depression screening: declined.   The following portions of the patient's history were reviewed and updated as appropriate: allergies, current medications, past medical history, past surgical history and problem list.  Review of Systems  Pertinent items are noted in HPI.    Objective:   BP 133/88   Pulse 68   Wt 182 lb 11.2 oz (82.9 kg)   Breastfeeding? No   BMI 32.36 kg/m   General:  alert, cooperative and no distress   Breasts:  deferred, no complaints  Lungs: clear to auscultation bilaterally  Heart:  regular rate and rhythm  Abdomen: soft, nontender   Vulva: Unable to assess, pt declined  Vagina: Unable to assess, pt declined  Cervix:  Unable to assess, pt declined  Corpus: Unable to assess, pt declined  Adnexa:   Unable to assess, pt declined      Assessment:   Postpartum exam Eight (8) wks s/p low transverse cesarean section Contraception counseling   Plan:   Release to work completed.   Risks, benefits and side effects of Nexplanon reviewed. Sample Taytulla given to help with bleeding.   Reviewed red flag symptoms and when to call.   Follow up as needed.    Gunnar Bulla, CNM

## 2017-04-10 NOTE — Telephone Encounter (Signed)
vm not set up. Will try later.

## 2017-07-07 ENCOUNTER — Encounter: Payer: Self-pay | Admitting: Emergency Medicine

## 2017-07-07 ENCOUNTER — Emergency Department
Admission: EM | Admit: 2017-07-07 | Discharge: 2017-07-07 | Disposition: A | Discharge status: Home / Self Care | Payer: Medicaid Other | Attending: Emergency Medicine | Admitting: Emergency Medicine

## 2017-07-07 DIAGNOSIS — A6004 Herpesviral vulvovaginitis: Secondary | ICD-10-CM | POA: Diagnosis not present

## 2017-07-07 DIAGNOSIS — A64 Unspecified sexually transmitted disease: Secondary | ICD-10-CM

## 2017-07-07 DIAGNOSIS — A6000 Herpesviral infection of urogenital system, unspecified: Secondary | ICD-10-CM

## 2017-07-07 DIAGNOSIS — Z113 Encounter for screening for infections with a predominantly sexual mode of transmission: Secondary | ICD-10-CM | POA: Diagnosis present

## 2017-07-07 LAB — URINALYSIS, ROUTINE W REFLEX MICROSCOPIC
BILIRUBIN URINE: NEGATIVE
GLUCOSE, UA: NEGATIVE mg/dL
KETONES UR: NEGATIVE mg/dL
NITRITE: NEGATIVE
PH: 6 (ref 5.0–8.0)
Protein, ur: NEGATIVE mg/dL
SPECIFIC GRAVITY, URINE: 1.018 (ref 1.005–1.030)

## 2017-07-07 LAB — WET PREP, GENITAL
Clue Cells Wet Prep HPF POC: NONE SEEN
SPERM: NONE SEEN
TRICH WET PREP: NONE SEEN
YEAST WET PREP: NONE SEEN

## 2017-07-07 LAB — CHLAMYDIA/NGC RT PCR (ARMC ONLY)
Chlamydia Tr: NOT DETECTED
N gonorrhoeae: NOT DETECTED

## 2017-07-07 LAB — POCT PREGNANCY, URINE: Preg Test, Ur: NEGATIVE

## 2017-07-07 MED ORDER — ACYCLOVIR 400 MG PO TABS: 400.0000 mg | ORAL_TABLET | Freq: Every day | ORAL | 3 refills | Status: AC

## 2017-07-07 MED ORDER — AZITHROMYCIN 500 MG PO TABS
1000.0000 mg | ORAL_TABLET | Freq: Once | ORAL | Status: AC
  Administered 2017-07-07: 1000 mg via ORAL
  Filled 2017-07-07: qty 2

## 2017-07-07 MED ORDER — CEFTRIAXONE SODIUM 250 MG IJ SOLR
250.0000 mg | Freq: Once | INTRAMUSCULAR | Status: AC
  Administered 2017-07-07: 250 mg via INTRAMUSCULAR
  Filled 2017-07-07: qty 250

## 2017-07-07 NOTE — ED Triage Notes (Signed)
Pt in via POV with complaints of left side pelvic pain x a few days; reports having a period x 2 weeks.  Pt denies any urinary symptoms, reports brown vaginal discharge.  Pt ambulatory to triage, NAD noted at this time.
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
## 2017-07-07 NOTE — ED Notes (Signed)
See triage note  states she developed some vaginal discharge couple of days ago.Also having some lower abd discomfort

## 2017-07-07 NOTE — ED Provider Notes (Signed)
[facility] Emergency Department Provider Note  ____________________________________________   First MD Initiated Contact with Patient 07/07/17 1511     (approximate)  I have reviewed the triage vital signs and the nursing notes.   HISTORY  Chief Complaint Pelvic Pain    HPI Lacey Riggs is a 22 y.o. female is complaining of left-sided pelvic pain, she states she has an implant so she does not have regular periods, she states she feels like she has had her period for about 2 weeks this time, she is complaining of a burning tingling sensation in the vaginal area, some vaginal discharge, she would like to have STD testing done, she denies fever or chills, she denies vomiting or diarrhea  Past Medical History:  Diagnosis Date  . Anemia     Patient Active Problem List   Diagnosis Date Noted  . S/P C-section 03/01/2017  . Need for chickenpox vaccination 01/20/2017    Past Surgical History:  Procedure Laterality Date  . CESAREAN SECTION N/A 02/11/2017   Procedure: CESAREAN SECTION;  Surgeon: Linzie CollinEvans, David James, MD;  Location: [facility] ORS;  Service: Obstetrics;  Laterality: N/A;  . NO PAST SURGERIES    . none      Prior to Admission medications   Medication Sig Start Date End Date Taking? Authorizing Provider  acyclovir (ZOVIRAX) 400 MG tablet Take 1 tablet (400 mg total) by mouth 5 (five) times daily. 07/07/17   Fisher, Roselyn BeringSusan W, PA-C  Prenatal Vit-Fe Fumarate-FA (MULTIVITAMIN-PRENATAL) 27-0.8 MG TABS tablet Take 1 tablet by mouth daily at 12 noon.    [provider]    Allergies Patient has no known allergies.  Family History  Problem Relation Age of Onset  . Anemia Mother   . Diabetes Paternal Grandmother   . Hypertension Paternal Grandmother     Social History Social History   Tobacco Use  . Smoking status: Never Smoker  . Smokeless tobacco: Never Used  Substance Use Topics  . Alcohol use: No  . Drug use: No    Review of  Systems  Constitutional: No fever/chills Eyes: No visual changes. ENT: No sore throat. Respiratory: Denies cough Genitourinary: Negative for dysuria.  Positive for vaginal pain and discharge Musculoskeletal: Negative for back pain. Skin: Negative for rash.    ____________________________________________   PHYSICAL EXAM:  VITAL SIGNS: ED Triage Vitals  Enc Vitals Group     BP 07/07/17 1424 123/73     Pulse Rate 07/07/17 1424 91     Resp 07/07/17 1424 16     Temp 07/07/17 1424 98.2 F (36.8 C)     Temp Source 07/07/17 1424 Oral     SpO2 07/07/17 1424 100 %     Weight 07/07/17 1425 150 lb (68 kg)     Height 07/07/17 1425 5\' 4"  (1.626 m)     Head Circumference --      Peak Flow --      Pain Score 07/07/17 1424 3     Pain Loc --      Pain Edu? --      Excl. in GC? --     Constitutional: Alert and oriented. Well appearing and in no acute distress. Eyes: Conjunctivae are normal.  Head: Atraumatic. Nose: No congestion/rhinnorhea. Mouth/Throat: Mucous membranes are moist.   Cardiovascular: Normal rate, regular rhythm.  Heart sounds are normal Respiratory: Normal respiratory effort.  No retractions, lungs are clear to auscultation GU: Pelvic exam shows 2 red angry lesions on the inner labia  typical of herpes, there is also a greenish colored discharge, swabs were obtained for GC chlamydia and wet prep Musculoskeletal: FROM all extremities, warm and well perfused Neurologic:  Normal speech and language.  Skin:  Skin is warm, dry and intact. No rash noted. Psychiatric: Mood and affect are normal. Speech and behavior are normal.  ____________________________________________   LABS (all labs ordered are listed, but only abnormal results are displayed)  Labs Reviewed  WET PREP, GENITAL - Abnormal; Notable for the following components:      Result Value   WBC, Wet Prep HPF POC MODERATE (*)    All other components within normal limits  URINALYSIS, ROUTINE W REFLEX  MICROSCOPIC - Abnormal; Notable for the following components:   Color, Urine YELLOW (*)    APPearance CLEAR (*)    Hgb urine dipstick MODERATE (*)    Leukocytes, UA LARGE (*)    Bacteria, UA RARE (*)    Squamous Epithelial / LPF 0-5 (*)    All other components within normal limits  CHLAMYDIA/NGC RT PCR ([facility] ONLY)  POC URINE PREG, ED  POCT PREGNANCY, URINE   ____________________________________________   ____________________________________________  RADIOLOGY    ____________________________________________   PROCEDURES  Procedure(s) performed: No      ____________________________________________   INITIAL IMPRESSION / ASSESSMENT AND PLAN / ED COURSE  Pertinent labs & imaging results that were available during my care of the patient were reviewed by me and considered in my medical decision making (see chart for details).  Patient is a 22 year old female complaining of vaginal discharge and vaginal pain, on physical exam there are 2 red angry lesions anterior vault and on the labia typical of herpes, vaginal swabs were obtained, the wet prep shows many white blood cells, chlamydia and gonorrhea test are negative, the patient was treated with Rocephin 250 mg IM and Zithromax 1 g p.o. prior to discharge, she was also given a prescription for acyclovir 400 mg, she was instructed to follow-up with the health department for additional testing for herpes, patient states she understands and will follow up, she states she will comply with our instructions, she was instructed to not have sex for at least 7 days, she is to always use a condom during intercourse as she may pass the virus or any other STD to someone else, patient states she understands, she was discharged in stable condition     As part of my medical decision making, I reviewed the following data within the electronic MEDICAL RECORD NUMBER  ____________________________________________   FINAL CLINICAL  IMPRESSION(S) / ED DIAGNOSES  Final diagnoses:  Genital herpes simplex, unspecified site  STI (sexually transmitted infection)      NEW MEDICATIONS STARTED DURING THIS VISIT:  This SmartLink is deprecated. Use AVSMEDLIST instead to display the medication list for a patient.   Note:  This document was prepared using Dragon voice recognition software and may include unintentional dictation errors.    Faythe Ghee, PA-C 07/07/17 2040    Merrily Brittle, MD 07/08/17 Moses Manners

## 2017-09-18 ENCOUNTER — Other Ambulatory Visit: Payer: Self-pay

## 2017-09-18 ENCOUNTER — Encounter: Payer: Self-pay | Admitting: Emergency Medicine

## 2017-09-18 ENCOUNTER — Emergency Department
Admission: EM | Admit: 2017-09-18 | Discharge: 2017-09-18 | Disposition: A | Discharge status: Home / Self Care | Payer: Medicaid Other | Attending: Emergency Medicine | Admitting: Emergency Medicine

## 2017-09-18 DIAGNOSIS — Z5321 Procedure and treatment not carried out due to patient leaving prior to being seen by health care provider: Secondary | ICD-10-CM | POA: Insufficient documentation

## 2017-09-18 DIAGNOSIS — N898 Other specified noninflammatory disorders of vagina: Secondary | ICD-10-CM | POA: Diagnosis not present

## 2017-09-18 NOTE — ED Triage Notes (Signed)
Pt to ED via POV, pt states that she is having vaginal irritation. Pt states that she recently had intercourse and now her vagina feels raw. Pt states that she is having some white/yellow discharge but denies any bleeding. Pt in NAD at this time.

## 2017-09-18 NOTE — ED Triage Notes (Deleted)
Pt ems for intentional overdose. Per ems pt took 4 10mg  amitriptyline and 10 200 mg benzonatate. Per pt she took pills to hurt self. Pt very sleepy in triage. VSS.

## 2017-09-18 NOTE — ED Notes (Signed)
Patient called to be roomed x3, no response noted at this time from patient.  Staff checked restrooms, lobby, and outdoor facility.   

## 2017-09-18 NOTE — ED Notes (Signed)
Patient called to be roomed x2, no response noted at this time from patient.  Staff checked restrooms, lobby, and outdoor facility.   

## 2017-09-18 NOTE — ED Notes (Signed)
All triage entries by me are on wrong pt.

## 2017-09-18 NOTE — ED Notes (Signed)
Patient called to be roomed x1, no response noted at this time from patient.  Staff checked restrooms, lobby, and outdoor facility.   

## 2017-09-19 ENCOUNTER — Emergency Department
Admission: EM | Admit: 2017-09-19 | Discharge: 2017-09-19 | Disposition: A | Discharge status: Home / Self Care | Payer: Medicaid Other | Attending: Emergency Medicine | Admitting: Emergency Medicine

## 2017-09-19 ENCOUNTER — Encounter: Payer: Self-pay | Admitting: Emergency Medicine

## 2017-09-19 DIAGNOSIS — N3000 Acute cystitis without hematuria: Secondary | ICD-10-CM | POA: Diagnosis not present

## 2017-09-19 DIAGNOSIS — Z79899 Other long term (current) drug therapy: Secondary | ICD-10-CM | POA: Insufficient documentation

## 2017-09-19 DIAGNOSIS — N898 Other specified noninflammatory disorders of vagina: Secondary | ICD-10-CM | POA: Insufficient documentation

## 2017-09-19 DIAGNOSIS — R102 Pelvic and perineal pain: Secondary | ICD-10-CM | POA: Diagnosis present

## 2017-09-19 LAB — URINALYSIS, COMPLETE (UACMP) WITH MICROSCOPIC
BILIRUBIN URINE: NEGATIVE
Glucose, UA: NEGATIVE mg/dL
Hgb urine dipstick: NEGATIVE
Ketones, ur: NEGATIVE mg/dL
Nitrite: NEGATIVE
PROTEIN: NEGATIVE mg/dL
SPECIFIC GRAVITY, URINE: 1.019 (ref 1.005–1.030)
pH: 5 (ref 5.0–8.0)

## 2017-09-19 LAB — POCT PREGNANCY, URINE: PREG TEST UR: NEGATIVE

## 2017-09-19 MED ORDER — SULFAMETHOXAZOLE-TRIMETHOPRIM 800-160 MG PO TABS: 1.0000 | ORAL_TABLET | Freq: Two times a day (BID) | ORAL | 0 refills | Status: DC

## 2017-09-19 MED ORDER — LIDOCAINE HCL 2 % EX GEL: 1.0000 "application " | Freq: Once | CUTANEOUS | Status: DC

## 2017-09-19 MED ORDER — LIDOCAINE 5 % EX OINT: 1.0000 "application " | TOPICAL_OINTMENT | CUTANEOUS | 0 refills | Status: AC | PRN

## 2017-09-19 NOTE — ED Triage Notes (Signed)
Patient reports vaginal soreness x 2 days.  Patient states, "I hadn't had sex in 47 days but then I did 2 days ago and my vagina hurt really bad after, it really hurt when I peed.  But now it's just sore."  Patient states she does not want to have a pelvic exam, "I'm too sore for that."  Patient reports yellowish white vaginal discharge.  Also reports she was tested for STDs approx. 1 month ago.  Patient states, "I just want them to look and see if there's anything wrong."

## 2017-09-19 NOTE — ED Provider Notes (Signed)
[facility] Emergency Department Provider Note  ____________________________________________  Time seen: Approximately 4:00 PM  I have reviewed the triage vital signs and the nursing notes.   HISTORY  Chief Complaint Vaginal Pain    HPI Lacey Riggs is a 22 y.o. female who presents to the emergency department for evaluation and treatment of vaginal pain.  She states that it had been 45-47 days since she had last had intercourse and once she "did it again" she got very sore.  She has had a yellow/white discharge since that time.  She states that the vagina is very sore and has been swollen at times.  She denies any abdominal pain.  She states that pain only occurs when she is "trying to put something inside."  She also states that she has some burning when she urinates, but states that she feels it is more related to her vagina being "raw."  Past Medical History:  Diagnosis Date  . Anemia     Patient Active Problem List   Diagnosis Date Noted  . S/P C-section 03/01/2017  . Need for chickenpox vaccination 01/20/2017    Past Surgical History:  Procedure Laterality Date  . CESAREAN SECTION N/A 02/11/2017   Procedure: CESAREAN SECTION;  Surgeon: Linzie CollinEvans, David James, MD;  Location: [facility] ORS;  Service: Obstetrics;  Laterality: N/A;  . NO PAST SURGERIES    . none      Prior to Admission medications   Medication Sig Start Date End Date Taking? Authorizing Provider  acyclovir (ZOVIRAX) 400 MG tablet Take 1 tablet (400 mg total) by mouth 5 (five) times daily. 07/07/17   Fisher, Roselyn BeringSusan W, PA-C  lidocaine (XYLOCAINE) 5 % ointment Apply 1 application topically as needed. 09/19/17   Lonisha Bobby, Rulon Eisenmengerari B, FNP  Prenatal Vit-Fe Fumarate-FA (MULTIVITAMIN-PRENATAL) 27-0.8 MG TABS tablet Take 1 tablet by mouth daily at 12 noon.    [provider]  sulfamethoxazole-trimethoprim (BACTRIM DS,SEPTRA DS) 800-160 MG tablet Take 1 tablet by mouth 2 (two) times daily. 09/19/17    Chinita Pesterriplett, Zonia Caplin B, FNP    Allergies Patient has no known allergies.  Family History  Problem Relation Age of Onset  . Anemia Mother   . Diabetes Paternal Grandmother   . Hypertension Paternal Grandmother     Social History Social History   Tobacco Use  . Smoking status: Never Smoker  . Smokeless tobacco: Never Used  Substance Use Topics  . Alcohol use: No    Frequency: Never  . Drug use: No    Review of Systems Constitutional: Negative for fever. Respiratory: Negative for shortness of breath or cough. Gastrointestinal: Negative for abdominal pain; negative for nausea , negative for vomiting. Genitourinary: Positive for dysuria , negative for vaginal discharge. Musculoskeletal: Negative for back pain. Skin: Positive for vaginal irritation. ____________________________________________   PHYSICAL EXAM:  VITAL SIGNS: ED Triage Vitals  Enc Vitals Group     BP 09/19/17 1423 130/60     Pulse Rate 09/19/17 1423 (!) 110     Resp 09/19/17 1423 16     Temp 09/19/17 1423 98.2 F (36.8 C)     Temp Source 09/19/17 1423 Oral     SpO2 09/19/17 1423 98 %     Weight 09/19/17 1424 170 lb (77.1 kg)     Height 09/19/17 1424 5\' 4"  (1.626 m)     Head Circumference --      Peak Flow --      Pain Score 09/19/17 1424 0  Pain Loc --      Pain Edu? --      Excl. in GC? --     Constitutional: Alert and oriented. Well appearing and in no acute distress. Eyes: Conjunctivae are normal. PERRL. EOMI. Head: Atraumatic. Nose: No congestion/rhinnorhea. Mouth/Throat: Mucous membranes are moist. Respiratory: Normal respiratory effort.  No retractions. Gastrointestinal: Abdomen is soft, nontender, no rebound or guarding.  Bowel sounds are present and active x4 quadrants. Genitourinary: Pelvic exam: Patient refused.  Visual external exam shows skin irritation of the labia minora and at the vestibule without any visible external exudates. Musculoskeletal: No extremity tenderness nor edema.   Neurologic:  Normal speech and language. No gross focal neurologic deficits are appreciated. Speech is normal. No gait instability. Skin:  Skin is warm, dry and intact. No rash noted. Psychiatric: Mood and affect are normal. Speech and behavior are normal.  ____________________________________________   LABS (all labs ordered are listed, but only abnormal results are displayed)  Labs Reviewed  URINALYSIS, COMPLETE (UACMP) WITH MICROSCOPIC - Abnormal; Notable for the following components:      Result Value   Color, Urine YELLOW (*)    APPearance HAZY (*)    Leukocytes, UA MODERATE (*)    Bacteria, UA RARE (*)    Squamous Epithelial / LPF 0-5 (*)    All other components within normal limits  POC URINE PREG, ED  POCT PREGNANCY, URINE   ____________________________________________  RADIOLOGY  Not indicated ____________________________________________   PROCEDURES  Procedure(s) performed: None  ____________________________________________  22 year old female presenting to the emergency department for vague symptoms of vaginal irritation that is worse with intercourse.  Patient adamantly refused a pelvic exam with the exception of a visual skin exam.  She was advised that if she has STD that a visual exam will not be sufficient in diagnosing.  She was also given a list of community resources that included the STD clinic at the health department and encouraged to schedule an appointment if she does not start feeling better in the next few days.  Urinalysis is concerning for acute cystitis and she will be placed on Bactrim.  She was given some lidocaine gel to apply just inside of the vagina and on the labia.  She was encouraged to return to the emergency department for symptoms of change or worsen if she is unable to schedule an appointment.  INITIAL IMPRESSION / ASSESSMENT AND PLAN / ED COURSE  Pertinent labs & imaging results that were available during my care of the patient were  reviewed by me and considered in my medical decision making (see chart for details).  ____________________________________________   FINAL CLINICAL IMPRESSION(S) / ED DIAGNOSES  Final diagnoses:  Acute cystitis without hematuria  Vaginal irritation    Note:  This document was prepared using Dragon voice recognition software and may include unintentional dictation errors.    Chinita Pester, FNP 09/20/17 0005    Sharman Cheek, MD 09/20/17 2137

## 2018-02-02 ENCOUNTER — Ambulatory Visit: Payer: Medicaid Other | Admitting: Certified Nurse Midwife

## 2018-02-22 ENCOUNTER — Ambulatory Visit: Payer: Self-pay | Admitting: Certified Nurse Midwife

## 2018-03-04 ENCOUNTER — Ambulatory Visit: Payer: Self-pay | Admitting: Certified Nurse Midwife

## 2018-04-01 ENCOUNTER — Emergency Department
Admission: EM | Admit: 2018-04-01 | Discharge: 2018-04-01 | Disposition: A | Discharge status: Home / Self Care | Payer: Self-pay | Attending: Emergency Medicine | Admitting: Emergency Medicine

## 2018-04-01 ENCOUNTER — Emergency Department: Payer: Self-pay

## 2018-04-01 ENCOUNTER — Encounter: Payer: Self-pay | Admitting: Emergency Medicine

## 2018-04-01 DIAGNOSIS — S62306A Unspecified fracture of fifth metacarpal bone, right hand, initial encounter for closed fracture: Secondary | ICD-10-CM | POA: Insufficient documentation

## 2018-04-01 DIAGNOSIS — Y999 Unspecified external cause status: Secondary | ICD-10-CM | POA: Insufficient documentation

## 2018-04-01 DIAGNOSIS — S098XXA Other specified injuries of head, initial encounter: Secondary | ICD-10-CM | POA: Insufficient documentation

## 2018-04-01 DIAGNOSIS — Y929 Unspecified place or not applicable: Secondary | ICD-10-CM | POA: Insufficient documentation

## 2018-04-01 DIAGNOSIS — Y939 Activity, unspecified: Secondary | ICD-10-CM | POA: Insufficient documentation

## 2018-04-01 DIAGNOSIS — S6291XA Unspecified fracture of right wrist and hand, initial encounter for closed fracture: Secondary | ICD-10-CM

## 2018-04-01 DIAGNOSIS — S0990XA Unspecified injury of head, initial encounter: Secondary | ICD-10-CM

## 2018-04-01 LAB — POC URINE PREG, ED: PREG TEST UR: NEGATIVE

## 2018-04-01 MED ORDER — CYCLOBENZAPRINE HCL 10 MG PO TABS
10.0000 mg | ORAL_TABLET | Freq: Once | ORAL | Status: AC
  Administered 2018-04-01: 10 mg via ORAL
  Filled 2018-04-01: qty 1

## 2018-04-01 MED ORDER — ACETAMINOPHEN 325 MG PO TABS
650.0000 mg | ORAL_TABLET | Freq: Once | ORAL | Status: AC
  Administered 2018-04-01: 650 mg via ORAL
  Filled 2018-04-01: qty 2

## 2018-04-01 MED ORDER — METOCLOPRAMIDE HCL 10 MG PO TABS
10.0000 mg | ORAL_TABLET | Freq: Once | ORAL | Status: AC
  Administered 2018-04-01: 10 mg via ORAL
  Filled 2018-04-01: qty 1

## 2018-04-01 MED ORDER — DIPHENHYDRAMINE HCL 25 MG PO CAPS
50.0000 mg | ORAL_CAPSULE | Freq: Once | ORAL | Status: AC
  Administered 2018-04-01: 50 mg via ORAL
  Filled 2018-04-01: qty 2

## 2018-04-01 MED ORDER — IBUPROFEN 400 MG PO TABS
400.0000 mg | ORAL_TABLET | Freq: Once | ORAL | Status: AC | PRN
  Administered 2018-04-01: 400 mg via ORAL
  Filled 2018-04-01: qty 1

## 2018-04-01 NOTE — ED Notes (Signed)
Pt c/o right ear and head pain. Ear with bruising. Pt denies blurry vision.
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
## 2018-04-01 NOTE — ED Notes (Signed)
In lobby in nad.  Have ct ordered.  Pt states she has not been to ct yet.
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
## 2020-04-23 ENCOUNTER — Other Ambulatory Visit: Payer: Self-pay

## 2020-04-23 ENCOUNTER — Ambulatory Visit (LOCAL_COMMUNITY_HEALTH_CENTER): Payer: Medicaid Other

## 2020-04-23 VITALS — BP 122/76 | Ht 64.0 in | Wt 166.5 lb

## 2020-04-23 DIAGNOSIS — Z3201 Encounter for pregnancy test, result positive: Secondary | ICD-10-CM

## 2020-04-23 LAB — PREGNANCY, URINE: Preg Test, Ur: POSITIVE — AB

## 2020-04-23 MED ORDER — PRENATAL 27-0.8 MG PO TABS
1.0000 | ORAL_TABLET | Freq: Every day | ORAL | 0 refills | Status: AC
Start: 2020-04-23 — End: 2020-08-01

## 2020-04-23 NOTE — Progress Notes (Signed)
UPT positive. Plans prenatal care at [facility]. Pt reports she was seen by [facility] last week for abd. Pain and was dx with ovarian cyst. Consult with Beatris Si, PA who advises pt to establish prenatal care and  to seek immediate medical attention if abd pain increases and/or accompanied with bleeding. RN counseled pt on provider recommendations. Questions answered and  Reports understanding. To clerk for preadmit. Jerel Shepherd, RN

## 2020-04-24 NOTE — Progress Notes (Signed)
Consulted by RN re: patient report of ovarian cyst found on exam last week at other facility and positive pregnancy test today.  Reviewed RN note and agree that it reflects discussion, recommendations and plan of care.

## 2020-05-07 NOTE — Progress Notes (Signed)
Follow-up on +PT. Per Epic notes, patient was seen at [facility] for new OB on 04/30/2020.Marland KitchenBurt Knack, RN

## 2020-10-07 ENCOUNTER — Encounter: Payer: Self-pay | Admitting: Obstetrics and Gynecology

## 2020-10-07 ENCOUNTER — Observation Stay
Admission: EM | Admit: 2020-10-07 | Discharge: 2020-10-07 | Disposition: A | Discharge status: Home / Self Care | Payer: Medicaid Other | Attending: Obstetrics and Gynecology | Admitting: Obstetrics and Gynecology

## 2020-10-07 ENCOUNTER — Other Ambulatory Visit: Payer: Self-pay

## 2020-10-07 DIAGNOSIS — R197 Diarrhea, unspecified: Secondary | ICD-10-CM | POA: Insufficient documentation

## 2020-10-07 DIAGNOSIS — Z3A32 32 weeks gestation of pregnancy: Secondary | ICD-10-CM | POA: Diagnosis not present

## 2020-10-07 DIAGNOSIS — O212 Late vomiting of pregnancy: Secondary | ICD-10-CM | POA: Diagnosis not present

## 2020-10-07 DIAGNOSIS — R112 Nausea with vomiting, unspecified: Secondary | ICD-10-CM

## 2020-10-07 DIAGNOSIS — O26893 Other specified pregnancy related conditions, third trimester: Secondary | ICD-10-CM

## 2020-10-07 MED ORDER — LACTATED RINGERS IV BOLUS
1000.0000 mL | Freq: Once | INTRAVENOUS | Status: AC
  Administered 2020-10-07: 1000 mL via INTRAVENOUS

## 2020-10-07 MED ORDER — ONDANSETRON 4 MG PO TBDP
ORAL_TABLET | ORAL | Status: AC
  Administered 2020-10-07: 4 mg via ORAL
  Filled 2020-10-07: qty 1

## 2020-10-07 MED ORDER — ONDANSETRON 4 MG PO TBDP: 4.0000 mg | ORAL_TABLET | Freq: Once | ORAL | Status: AC

## 2020-10-07 NOTE — ED Triage Notes (Signed)
Pt reports decreased in the amount she feels the baby move. States still feels some but not as active.Marland KitchenMarland KitchenConsulted with LDR, pt being sent to LDR for evaluation

## 2020-10-07 NOTE — Progress Notes (Signed)
Discharge home. Discharge paperwork given. Questions answered. Left floor ambulatory with sig other. Lacey Riggs

## 2020-10-07 NOTE — OB Triage Note (Signed)
Pt reports thinking she ate something yesterday that has made her sick. Reports nausea and vomiting since around 0230 this am. Pt also reports loose stools. Abdominal cramping per pt. Fetal movement noted when placed on external monitor. Elaina Hoops

## 2020-10-07 NOTE — OB Triage Note (Signed)
Pt reports feeling better. Tolerating POs Jerrie Gullo, Heywood Bene

## 2020-10-07 NOTE — ED Triage Notes (Signed)
Pt reports is [redacted] weeks pregnant and thinks she ate something bad yesterday because she has had NV all night

## 2020-10-08 NOTE — Discharge Summary (Signed)
    L&D OB Triage Note  SUBJECTIVE Lacey Riggs is a 25 y.o. G2P1001 female at [redacted]w[redacted]d, EDD Estimated Date of Delivery: 12/03/20 who presented to triage with complaints of N/V and loose stools after "eating something bad".  C/O occ contractions.  Denies Bleeding ROM.  OB History  Gravida Para Term Preterm AB Living  2 1 1  0 0 1  SAB IAB Ectopic Multiple Live Births  0 0 0 0 1    # Outcome Date GA Lbr Len/2nd Weight Sex Delivery Anes PTL Lv  2 Current           1 Term 02/11/17 [redacted]w[redacted]d  3310 g M CS-LVertical EPI  LIV     Name: Higashi,BOY Jalayna     Apgar1: 2  Apgar5: 5    No medications prior to admission.     OBJECTIVE  Nursing Evaluation:   BP 123/73 (BP Location: Left Arm)   Pulse 91   Temp 98.1 F (36.7 C) (Oral)   Resp 16   Ht 5\' 4"  (1.626 m)   Wt 81.2 kg   LMP 02/27/2020 (Exact Date)   BMI 30.73 kg/m    Findings:        Pt given zofran and IV hydrated. - Condition improved and pt desired D/C      NST was performed and has been reviewed by me.  NST INTERPRETATION: Category I  Mode: External Baseline Rate (A): 140 bpm Variability: Moderate Accelerations: 15 x 15 Decelerations: Variable     Contraction Frequency (min): ui noted  ASSESSMENT Impression:  1.  Pregnancy:  G2P1001 at [redacted]w[redacted]d , EDD Estimated Date of Delivery: 12/03/20 2.  Reassuring fetal and maternal status 3.  GI symptoms  PLAN 1. Reassurance given. 2. Discharge home with standard labor precautions given to return to L&D or call the office for problems. 3. Continue routine prenatal care.

## 2021-01-03 ENCOUNTER — Ambulatory Visit: Payer: Medicaid Other

## 2021-01-11 ENCOUNTER — Ambulatory Visit: Payer: Medicaid Other | Admitting: Advanced Practice Midwife

## 2021-01-11 ENCOUNTER — Encounter: Payer: Self-pay | Admitting: Advanced Practice Midwife

## 2021-01-11 ENCOUNTER — Other Ambulatory Visit: Payer: Self-pay

## 2021-01-11 DIAGNOSIS — F1290 Cannabis use, unspecified, uncomplicated: Secondary | ICD-10-CM

## 2021-01-11 DIAGNOSIS — Z113 Encounter for screening for infections with a predominantly sexual mode of transmission: Secondary | ICD-10-CM

## 2021-01-11 DIAGNOSIS — B9689 Other specified bacterial agents as the cause of diseases classified elsewhere: Secondary | ICD-10-CM

## 2021-01-11 DIAGNOSIS — Z720 Tobacco use: Secondary | ICD-10-CM

## 2021-01-11 DIAGNOSIS — N760 Acute vaginitis: Secondary | ICD-10-CM

## 2021-01-11 LAB — WET PREP FOR TRICH, YEAST, CLUE
Trichomonas Exam: NEGATIVE
Yeast Exam: NEGATIVE

## 2021-01-11 MED ORDER — METRONIDAZOLE 500 MG PO TABS: 500.0000 mg | ORAL_TABLET | Freq: Two times a day (BID) | ORAL | 0 refills | Status: AC

## 2021-01-11 MED ORDER — METRONIDAZOLE 500 MG PO TABS
500.0000 mg | ORAL_TABLET | Freq: Three times a day (TID) | ORAL | 0 refills | Status: DC
Start: 2021-01-11 — End: 2021-01-11

## 2021-01-11 NOTE — Progress Notes (Signed)
Wet mount reviewed with provider, all orders completed.

## 2021-01-11 NOTE — Progress Notes (Signed)
[facility] STI clinic/screening visit  Subjective:  Lacey Riggs is a 25 y.o. SWF vaper G2P2  female being seen today for an STI screening visit. The patient reports they do have symptoms.  Patient reports that they do not desire a pregnancy in the next year.   They reported they are not interested in discussing contraception today.  Patient's last menstrual period was 02/27/2020 (exact date).   Patient has the following medical conditions:   Patient Active Problem List   Diagnosis Date Noted  . Need for chickenpox vaccination 01/20/2017    Chief Complaint  Patient presents with  . SEXUALLY TRANSMITTED DISEASE    HPI  Patient reports "I just got back together with my boyfriend". Last sex 01/08/21 without condom; with current partner x 7 years off and on. LMP 02/27/20. Last MJ 12/2020. Last ETOH 01/07/21 (1/2 bottle Sirach+1 beer) "occasionally". Last vaped yesterday. Last cigar 12/2020.  Last HIV test per patient/review of record was 11/08/20 Patient reports last pap was "I have no idea"  See flowsheet for further details and programmatic requirements.    The following portions of the patient's history were reviewed and updated as appropriate: allergies, current medications, past medical history, past social history, past surgical history and problem list.  Objective:  There were no vitals filed for this visit.  Physical Exam Vitals and nursing note reviewed.  Constitutional:      Appearance: Normal appearance. She is obese.  HENT:     Head: Normocephalic and atraumatic.     Mouth/Throat:     Mouth: Mucous membranes are moist.     Pharynx: Oropharynx is clear. No oropharyngeal exudate or posterior oropharyngeal erythema.  Eyes:     Conjunctiva/sclera: Conjunctivae normal.  Pulmonary:     Effort: Pulmonary effort is normal.  Chest:  Breasts:    Right: No axillary adenopathy or supraclavicular adenopathy.     Left: No axillary adenopathy or  supraclavicular adenopathy.  Abdominal:     Palpations: Abdomen is soft. There is no mass.     Tenderness: There is no abdominal tenderness. There is no rebound.     Comments: Poor tone, soft without masses or tenderness  Genitourinary:    General: Normal vulva.     Exam position: Lithotomy position.     Pubic Area: No rash or pubic lice.      Labia:        Right: No rash or lesion.        Left: No rash or lesion.      Vagina: Vaginal discharge (small amt white creamy leukorrhea, ph>4.5) present. No erythema, bleeding or lesions.     Cervix: Normal.     Uterus: Normal.      Adnexa: Right adnexa normal and left adnexa normal.     Rectum: Normal.  Lymphadenopathy:     Head:     Right side of head: No preauricular or posterior auricular adenopathy.     Left side of head: No preauricular or posterior auricular adenopathy.     Cervical: No cervical adenopathy.     Upper Body:     Right upper body: No supraclavicular or axillary adenopathy.     Left upper body: No supraclavicular or axillary adenopathy.     Lower Body: No right inguinal adenopathy. No left inguinal adenopathy.  Skin:    General: Skin is warm and dry.     Findings: No rash.  Neurological:     Mental Status: She is alert  and oriented to person, place, and time.     Assessment and Plan:  Lacey Riggs is a 25 y.o. female presenting to the [facility] Department for STI screening  1. Screening examination for venereal disease Treat wet mount per standing orders Immunization nurse consult - WET PREP FOR TRICH, YEAST, CLUE - Gonococcus culture - Chlamydia/Gonorrhea Hydro Lab     No follow-ups on file.  No future appointments.  Alberteen Spindle, CNM

## 2021-01-11 NOTE — Progress Notes (Signed)
Patient has noticed a foul odor and has been urinating more frequent

## 2021-01-15 LAB — GONOCOCCUS CULTURE

## 2022-03-27 ENCOUNTER — Emergency Department: Payer: Medicaid Other

## 2022-03-27 ENCOUNTER — Encounter: Payer: Self-pay | Admitting: Emergency Medicine

## 2022-03-27 DIAGNOSIS — S60112A Contusion of left thumb with damage to nail, initial encounter: Secondary | ICD-10-CM | POA: Diagnosis not present

## 2022-03-27 DIAGNOSIS — W231XXA Caught, crushed, jammed, or pinched between stationary objects, initial encounter: Secondary | ICD-10-CM | POA: Insufficient documentation

## 2022-03-27 DIAGNOSIS — S6992XA Unspecified injury of left wrist, hand and finger(s), initial encounter: Secondary | ICD-10-CM | POA: Diagnosis present

## 2022-03-27 NOTE — ED Triage Notes (Signed)
Pt presents via POV with complaints of left thumb pain after closing her hand in a door. She has full ROM but endorses pain with palpation. Bruising to the base of her nail.

## 2022-03-28 ENCOUNTER — Emergency Department
Admission: EM | Admit: 2022-03-28 | Discharge: 2022-03-28 | Disposition: A | Discharge status: Home / Self Care | Payer: Medicaid Other | Attending: Emergency Medicine | Admitting: Emergency Medicine

## 2022-03-28 DIAGNOSIS — S60112A Contusion of left thumb with damage to nail, initial encounter: Secondary | ICD-10-CM

## 2022-03-28 MED ORDER — IBUPROFEN 600 MG PO TABS
600.0000 mg | ORAL_TABLET | Freq: Once | ORAL | Status: AC
  Administered 2022-03-28: 600 mg via ORAL
  Filled 2022-03-28: qty 1

## 2022-03-28 NOTE — Discharge Instructions (Signed)
The goal is to keep the nail draining as long as it needs, so we recommend you soak your thumb in warm water two or three times a day for the next couple of days.  Use over the counter ibuprofen and/or acetaminophen (Tylenol) as needed for pain.

## 2022-03-28 NOTE — ED Provider Notes (Signed)
[facility] Provider Note    Event Date/Time   First MD Initiated Contact with Patient 03/28/22 0014     (approximate)   History   Hand Pain   HPI  Lacey Riggs is a 26 y.o. female who presents for evaluation of severe pain in her left thumb after smashing it in a car door.  She said it happened about 6 to 7 hours ago and the pain is gotten steadily worse.  Her thumb is now throbbing with pain that radiates throughout her hand.  There is bruising or bleeding beneath the proximal part of the left thumbnail.  No laceration to the thumb or the fingernail.     Physical Exam   Triage Vital Signs: ED Triage Vitals  Enc Vitals Group     BP 03/27/22 2327 127/85     Pulse Rate 03/27/22 2327 78     Resp 03/27/22 2327 18     Temp 03/27/22 2327 99 F (37.2 C)     Temp Source 03/27/22 2327 Oral     SpO2 03/27/22 2327 98 %     Weight 03/27/22 2328 77.1 kg (170 lb)     Height 03/27/22 2328 1.626 m (5\' 4" )     Head Circumference --      Peak Flow --      Pain Score 03/27/22 2331 3     Pain Loc --      Pain Edu? --      Excl. in [place]? --     Most recent vital signs: Vitals:   03/27/22 2327 03/28/22 0154  BP: 127/85 125/82  Pulse: 78 76  Resp: 18 16  Temp: 99 F (37.2 C) 98.9 F (37.2 C)  SpO2: 98% 98%     General: Awake, distressed due to pain, otherwise well-appearing. CV:  Good peripheral perfusion.  Resp:  Normal effort.  Abd:  No distention.  Other:  Left thumb has a subungual hematoma of approximately 50% of the area beneath the nail from midway to the proximal part of the nailbed.  No damage to the nail itself.  No significant swelling or deformity to the thumb.   ED Results / Procedures / Treatments    RADIOLOGY I viewed and interpreted the patient's hand x-rays.  There is no evidence of fracture or dislocation.    PROCEDURES:  Critical Care performed: No  (Trephenation, NOT Nail Removal) .Nail Removal  Date/Time: 03/28/2022  12:45 AM  Performed by: Hinda Kehr, MD Authorized by: Hinda Kehr, MD   Consent:    Consent obtained:  Verbal and emergent situation   Consent given by:  Patient   Risks discussed:  Bleeding, pain, infection and permanent nail deformity Universal protocol:    Patient identity confirmed:  Verbally with patient Location:    Hand:  L thumb Pre-procedure details:    Skin preparation:  Povidone-iodine Anesthesia:    Anesthesia method:  None Trephination:    Subungual hematoma drained: yes     Trephination instrument:  Cautery Post-procedure details:    Procedure completion:  Tolerated well, no immediate complications    MEDICATIONS ORDERED IN ED: Medications  ibuprofen (ADVIL) tablet 600 mg (600 mg Oral Given 03/28/22 0126)     IMPRESSION / MDM / ASSESSMENT AND PLAN / ED COURSE  I reviewed the triage vital signs and the nursing notes.  Differential diagnosis includes, but is not limited to, contusion, subungual hematoma, fracture.  Patient's presentation is most consistent with acute complicated illness / injury requiring diagnostic workup.  As documented above, the area is no evidence of fracture.  The patient has a substantial subungual hematoma and I explained to her the risks and benefits of trephination.  She agreed to proceed.  I used electrocautery and had immediate return of a relatively large volume of blood.  This provided almost immediate relief.  The patient is comfortable with the plan for discharge and outpatient follow-up.  I had my usual and customary post trephination management discussion and she agrees with the plan.       FINAL CLINICAL IMPRESSION(S) / ED DIAGNOSES   Final diagnoses:  Contusion of left thumb nail, initial encounter  Subungual hematoma of left thumb, initial encounter     Rx / DC Orders   ED Discharge Orders     None        Note:  This document was prepared using Dragon voice recognition  software and may include unintentional dictation errors.   Hinda Kehr, MD 03/28/22 [phone]

## 2022-04-30 ENCOUNTER — Inpatient Hospital Stay: Admission: RE | Admit: 2022-04-30 | Payer: Self-pay | Source: Ambulatory Visit

## 2022-04-30 ENCOUNTER — Ambulatory Visit
Admission: RE | Admit: 2022-04-30 | Discharge: 2022-04-30 | Disposition: A | Discharge status: Home / Self Care | Payer: Medicaid Other | Source: Ambulatory Visit | Attending: Emergency Medicine | Admitting: Emergency Medicine

## 2022-04-30 VITALS — BP 130/79 | HR 75 | Temp 98.5°F | Resp 18

## 2022-04-30 DIAGNOSIS — S60112D Contusion of left thumb with damage to nail, subsequent encounter: Secondary | ICD-10-CM | POA: Diagnosis not present

## 2022-04-30 MED ORDER — SULFAMETHOXAZOLE-TRIMETHOPRIM 800-160 MG PO TABS: 1.0000 | ORAL_TABLET | Freq: Two times a day (BID) | ORAL | 0 refills | Status: AC

## 2022-04-30 NOTE — Discharge Instructions (Addendum)
Take the antibiotic as directed.  Follow up with your primary care provider if your symptoms are not improving.     

## 2022-04-30 NOTE — ED Triage Notes (Signed)
Patient to Urgent Care with complaints of left sided thumb injury x1 month. Reports initially she trapped her thumb in a car door- states she works at [employer] and states she has been getting oil and other materials on her nail. Patients finger nail green/ bruised. Denies any drainage or swelling. Nails lifts and moves side to side.

## 2022-04-30 NOTE — ED Provider Notes (Signed)
Roderic Palau    CSN: [id number] Arrival date & time: 04/30/22  1651      History   Chief Complaint Chief Complaint  Patient presents with   Hand Problem    Thumb infection - Entered by patient    HPI Lacey Riggs is a 26 y.o. female.  Patient presents with concern for infection of her left thumb under her fingernail.  The area is yellow-green.  No drainage, fever, redness, numbness, weakness, or other symptoms.  Patient was seen at [facility] ED on 03/28/2022; diagnosed with subungual hematoma of the left thumb and contusion of the left thumb nail; x-ray negative; subungual hematoma drained.  The history is provided by the patient and medical records.    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 Authorizing Provider  sulfamethoxazole-trimethoprim (BACTRIM DS) 800-160 MG tablet Take 1 tablet by mouth 2 (two) times daily for 7 days. 04/30/22 05/07/22 Yes Sharion Balloon, NP  acyclovir (ZOVIRAX) 400 MG tablet Take 1 tablet (400 mg total) by mouth 5 (five) times daily. Patient not taking: Reported on 04/23/2020 07/07/17   Versie Starks, PA-C  lidocaine (XYLOCAINE) 5 % ointment Apply 1 application topically as needed. Patient not taking: Reported on 04/23/2020 09/19/17   Sherrie George B, FNP   Prenatal Vit-Fe Fumarate-FA (MULTIVITAMIN-PRENATAL) 27-0.8 MG TABS tablet Take 1 tablet by mouth daily at 12 noon.    [provider]    Family History Family History  Problem Relation Age of Onset   Anemia Mother    Diabetes Paternal Grandmother    Hypertension Paternal Grandmother     Social History Social History   Tobacco Use   Smoking status: Every Day    Types: E-cigarettes, Cigarettes, Cigars    Last attempt to quit: 07/25/2019    Years since quitting: 2.7   Smokeless tobacco: Never  Vaping Use   Vaping Use: Never used  Substance Use Topics   Alcohol use: Yes    Alcohol/week: 1.0 standard drink of alcohol    Types: 1 Cans of beer per week    Comment: 1/2 bottle Sirach +1 beer on 01/07/21   Drug use: Yes    Types: Marijuana    Comment: last use 12/2020     Allergies   Patient has no known allergies.   Review of Systems Review of Systems  Constitutional:  Negative for chills and fever.  Musculoskeletal:  Negative for arthralgias and joint swelling.  Skin:  Positive for color change and wound.  Neurological:  Negative for weakness and numbness.  All other systems reviewed and are negative.    Physical Exam Triage Vital Signs ED Triage Vitals  Enc Vitals Group     BP 04/30/22 1726 130/79     Pulse Rate 04/30/22 1726 75     Resp 04/30/22 1726 18     Temp 04/30/22 1726 98.5 F (36.9 C)     Temp src --      SpO2 04/30/22 1726 100 %     Weight --      Height --      Head Circumference --      Peak Flow --      Pain Score 04/30/22 1723 0     Pain Loc --      Pain Edu? --      Excl. in GC? --    No data found.  Updated Vital Signs BP 130/79   Pulse 75   Temp 98.5 F (36.9 C)   Resp 18   LMP 04/27/2022   SpO2 100%   Visual Acuity Right Eye Distance:   Left Eye Distance:   Bilateral Distance:    Right Eye Near:   Left Eye Near:    Bilateral Near:     Physical Exam Vitals and nursing note reviewed.  Constitutional:       General: She is not in acute distress.    Appearance: Normal appearance. She is well-developed. She is not ill-appearing.  HENT:     Mouth/Throat:     Mouth: Mucous membranes are moist.  Cardiovascular:     Rate and Rhythm: Normal rate and regular rhythm.     Heart sounds: Normal heart sounds.  Pulmonary:     Effort: Pulmonary effort is normal. No respiratory distress.     Breath sounds: Normal breath sounds.  Musculoskeletal:     Cervical back: Neck supple.  Skin:    General: Skin is warm and dry.     Capillary Refill: Capillary refill takes less than 2 seconds.     Findings: No erythema.     Comments: Left thumb nail discolored. No erythema or drainage. Nail in place.   Neurological:     Mental Status: She is alert.     Sensory: No sensory deficit.     Motor: No weakness.  Psychiatric:        Mood and Affect: Mood normal.        Behavior: Behavior normal.       UC Treatments / Results  Labs (all labs ordered are listed, but only abnormal results are displayed) Labs Reviewed - No data to display  EKG   Radiology No results found.  Procedures Procedures (including critical care time)  Medications Ordered in UC Medications - No data to display  Initial Impression / Assessment and Plan / UC Course  I have reviewed the triage vital signs and the nursing notes.  Pertinent labs & imaging results that were available during my care of the patient were reviewed by me and considered in my medical decision making (see chart for details).    Subungual hematoma of the left thumb.  Nail is firmly in place and no drainage.  Treating with Bactrim.  Instructed patient to follow up with her PCP if her symptoms are not improving.  Education provided on subungual hematoma.  She agrees to plan of care.    Final Clinical Impressions(s) / UC Diagnoses   Final diagnoses:  Subungual hematoma of left thumb, subsequent  encounter     Discharge Instructions      Take the  antibiotic as directed.  Follow up with your primary care provider if your symptoms are not improving.        ED Prescriptions     Medication Sig Dispense Auth. Provider   sulfamethoxazole-trimethoprim (BACTRIM DS) 800-160 MG tablet Take 1 tablet by mouth 2 (two) times daily for 7 days. 14 tablet Mickie Bail, NP      PDMP not reviewed this encounter.   Mickie Bail, NP 04/30/22 [phone]

## 2024-01-04 ENCOUNTER — Emergency Department

## 2024-01-04 ENCOUNTER — Emergency Department
Admission: EM | Admit: 2024-01-04 | Discharge: 2024-01-04 | Disposition: A | Discharge status: Home / Self Care | Attending: Emergency Medicine | Admitting: Emergency Medicine

## 2024-01-04 ENCOUNTER — Other Ambulatory Visit: Payer: Self-pay

## 2024-01-04 DIAGNOSIS — R1032 Left lower quadrant pain: Secondary | ICD-10-CM | POA: Diagnosis present

## 2024-01-04 DIAGNOSIS — N3090 Cystitis, unspecified without hematuria: Secondary | ICD-10-CM | POA: Insufficient documentation

## 2024-01-04 DIAGNOSIS — R109 Unspecified abdominal pain: Secondary | ICD-10-CM

## 2024-01-04 LAB — BASIC METABOLIC PANEL WITH GFR
Anion gap: 7 (ref 5–15)
BUN: 13 mg/dL (ref 6–20)
CO2: 26 mmol/L (ref 22–32)
Calcium: 8.9 mg/dL (ref 8.9–10.3)
Chloride: 110 mmol/L (ref 98–111)
Creatinine, Ser: 0.65 mg/dL (ref 0.44–1.00)
GFR, Estimated: >60 mL/min (ref >60)
Glucose, Bld: 106 mg/dL — ABNORMAL HIGH (ref 70–99)
Potassium: 3.8 mmol/L (ref 3.5–5.1)
Sodium: 143 mmol/L (ref 135–145)

## 2024-01-04 LAB — URINALYSIS, ROUTINE W REFLEX MICROSCOPIC
Bacteria, UA: NONE SEEN
Bilirubin Urine: NEGATIVE
Glucose, UA: NEGATIVE mg/dL
Ketones, ur: NEGATIVE mg/dL
Nitrite: NEGATIVE
Protein, ur: 100 mg/dL — AB
RBC / HPF: >50 RBC/hpf (ref 0–5)
Specific Gravity, Urine: 1.02 (ref 1.005–1.030)
WBC, UA: >50 WBC/hpf (ref 0–5)
pH: 6 (ref 5.0–8.0)

## 2024-01-04 LAB — CBC
HCT: 37.4 % (ref 36.0–46.0)
Hemoglobin: 12.3 g/dL (ref 12.0–15.0)
MCH: 29.5 pg (ref 26.0–34.0)
MCHC: 32.9 g/dL (ref 30.0–36.0)
MCV: 89.7 fL (ref 80.0–100.0)
Platelets: 246 10*3/uL (ref 150–400)
RBC: 4.17 MIL/uL (ref 3.87–5.11)
RDW: 13.8 % (ref 11.5–15.5)
WBC: 7.7 10*3/uL (ref 4.0–10.5)
nRBC: 0 % (ref 0.0–0.2)

## 2024-01-04 LAB — POC URINE PREG, ED: Preg Test, Ur: NEGATIVE

## 2024-01-04 MED ORDER — KETOROLAC TROMETHAMINE 30 MG/ML IJ SOLN
30.0000 mg | Freq: Once | INTRAMUSCULAR | Status: AC
  Administered 2024-01-04: 30 mg via INTRAMUSCULAR
  Filled 2024-01-04: qty 1

## 2024-01-04 MED ORDER — CEPHALEXIN 500 MG PO CAPS
500.0000 mg | ORAL_CAPSULE | Freq: Two times a day (BID) | ORAL | 0 refills | Status: AC
Start: 2024-01-04 — End: 2024-01-09

## 2024-01-04 MED ORDER — CEFTRIAXONE SODIUM 1 G IJ SOLR: 1.0000 g | Freq: Once | INTRAMUSCULAR | Status: DC

## 2024-01-04 MED ORDER — ONDANSETRON 4 MG PO TBDP: 4.0000 mg | ORAL_TABLET | Freq: Three times a day (TID) | ORAL | 0 refills | Status: AC | PRN

## 2024-01-04 MED ORDER — LIDOCAINE HCL (PF) 1 % IJ SOLN: 5.0000 mL | Freq: Once | INTRAMUSCULAR | Status: DC

## 2024-01-04 NOTE — ED Provider Notes (Signed)
 [facility] Provider Note    Event Date/Time   First MD Initiated Contact with Patient 01/04/24 [phone]     (approximate)   History   Flank Pain   HPI  Lacey Riggs is a 28 y.o. female Vernal to the emergency department with left-sided flank pain.  Started last night and continued today.  Dull pressure pain to her left flank that radiates to her left lower abdomen.  States that this feels similar to prior kidney infection in the past.  Does endorse burning with urination at the end of urination and only urinating a small amount.  Denies any blood in her urine or history of kidney stones.  Denies fever or chills, nausea or vomiting.     Physical Exam   Triage Vital Signs: ED Triage Vitals  Encounter Vitals Group     BP 01/04/24 0815 115/68     Girls Systolic BP Percentile --      Girls Diastolic BP Percentile --      Boys Systolic BP Percentile --      Boys Diastolic BP Percentile --      Pulse Rate 01/04/24 0815 96     Resp 01/04/24 0815 18     Temp 01/04/24 0815 97.6 F (36.4 C)     Temp Source 01/04/24 0815 Oral     SpO2 01/04/24 0815 95 %     Weight 01/04/24 0816 149 lb (67.6 kg)     Height 01/04/24 0816 5' 4 (1.626 m)     Head Circumference --      Peak Flow --      Pain Score 01/04/24 0814 7     Pain Loc --      Pain Education --      Exclude from Growth Chart --     Most recent vital signs: Vitals:   01/04/24 0815  BP: 115/68  Pulse: 96  Resp: 18  Temp: 97.6 F (36.4 C)  SpO2: 95%    Physical Exam Constitutional:      Appearance: She is well-developed.  HENT:     Head: Atraumatic.   Eyes:     Conjunctiva/sclera: Conjunctivae normal.    Cardiovascular:     Rate and Rhythm: Regular rhythm.  Pulmonary:     Effort: No respiratory distress.  Abdominal:     General: There is no distension.     Tenderness: There is no abdominal tenderness. There is no right CVA tenderness or left CVA tenderness.   Musculoskeletal:         General: Normal range of motion.     Cervical back: Normal range of motion.   Skin:    General: Skin is warm.     Capillary Refill: Capillary refill takes less than 2 seconds.   Neurological:     Mental Status: She is alert. Mental status is at baseline.   Psychiatric:        Mood and Affect: Mood normal.      IMPRESSION / MDM / ASSESSMENT AND PLAN / ED COURSE  I reviewed the triage vital signs and the nursing notes.  Differential diagnosis including urinary tract infection, pyelonephritis, pregnancy, kidney stone  Low suspicion for acute appendicitis or intra-abdominal abscess.  Do not feel that contrasted CT scan is necessary at this time   Labs (all labs ordered are listed, but only abnormal results are displayed) Labs interpreted as -    Labs Reviewed  URINALYSIS, ROUTINE W REFLEX MICROSCOPIC - Abnormal;  Notable for the following components:      Result Value   Color, Urine YELLOW (*)    APPearance CLOUDY (*)    Hgb urine dipstick SMALL (*)    Protein, ur 100 (*)    Leukocytes,Ua LARGE (*)    All other components within normal limits  BASIC METABOLIC PANEL WITH GFR - Abnormal; Notable for the following components:   Glucose, Bld 106 (*)    All other components within normal limits  URINE CULTURE  CBC  POC URINE PREG, ED    Pregnancy test is negative  UA did have blood in the urine.  Will obtain a CT scan to further evaluate for kidney stones  Nephrolithiasis but no obstructing kidney stone.  Findings concerning for possible cystitis, will treat for hemorrhagic cystitis.  Discussed symptomatic treatment with patient.  Offered IM Rocephin  however patient declined and wants to start on oral antibiotics today.  Discussed close follow-up with primary care physician and given a referral for PCP and information to primary care providers in the area.  Discussed return to the emergency department for any ongoing or worsening symptoms.     PROCEDURES:  Critical  Care performed: No  Procedures  Patient's presentation is most consistent with acute complicated illness / injury requiring diagnostic workup.   MEDICATIONS ORDERED IN ED: Medications  ketorolac  (TORADOL ) 30 MG/ML injection 30 mg (30 mg Intramuscular Given 01/04/24 0929)    FINAL CLINICAL IMPRESSION(S) / ED DIAGNOSES   Final diagnoses:  Left flank pain  Cystitis     Rx / DC Orders   ED Discharge Orders          Ordered    cephALEXin (KEFLEX) 500 MG capsule  2 times daily        01/04/24 1030    ondansetron  (ZOFRAN -ODT) 4 MG disintegrating tablet  Every 8 hours PRN        01/04/24 1031    Ambulatory Referral to Primary Care (Establish Care)        01/04/24 1034             Note:  This document was prepared using Dragon voice recognition software and may include unintentional dictation errors.   Suzanne Kirsch, MD 01/04/24 [phone]

## 2024-01-04 NOTE — ED Notes (Signed)
 See triage note  Presents with left flank pain and some dysuria  Sxs' started yesterday  Afebrile on arrival

## 2024-01-04 NOTE — ED Triage Notes (Signed)
 Pt to ED for L back and flank pain since last night. Pain radiates to LLQ. Endorses burning with urination.
